# Patient Record
Sex: Male | Born: 1965 | Race: White | Hispanic: No | Marital: Single | Smoking: Former smoker
Health system: Southern US, Community
[De-identification: ages and names within clinical notes are randomized; demographics above are authoritative.]

## PROBLEM LIST (undated history)

## (undated) DIAGNOSIS — I10 Essential (primary) hypertension: Secondary | ICD-10-CM

## (undated) DIAGNOSIS — F329 Major depressive disorder, single episode, unspecified: Secondary | ICD-10-CM

## (undated) DIAGNOSIS — Z9989 Dependence on other enabling machines and devices: Secondary | ICD-10-CM

## (undated) DIAGNOSIS — Z7901 Long term (current) use of anticoagulants: Secondary | ICD-10-CM

## (undated) DIAGNOSIS — F32A Depression, unspecified: Secondary | ICD-10-CM

## (undated) DIAGNOSIS — I4891 Unspecified atrial fibrillation: Secondary | ICD-10-CM

## (undated) DIAGNOSIS — G4733 Obstructive sleep apnea (adult) (pediatric): Secondary | ICD-10-CM

---

## 2006-07-03 ENCOUNTER — Encounter: Admission: RE | Admit: 2006-07-03 | Discharge: 2006-07-03 | Payer: Self-pay | Admitting: Internal Medicine

## 2015-03-03 DIAGNOSIS — I82409 Acute embolism and thrombosis of unspecified deep veins of unspecified lower extremity: Secondary | ICD-10-CM | POA: Insufficient documentation

## 2015-03-03 DIAGNOSIS — R079 Chest pain, unspecified: Secondary | ICD-10-CM | POA: Insufficient documentation

## 2015-03-03 DIAGNOSIS — I4891 Unspecified atrial fibrillation: Secondary | ICD-10-CM | POA: Diagnosis present

## 2015-03-03 DIAGNOSIS — F419 Anxiety disorder, unspecified: Secondary | ICD-10-CM | POA: Insufficient documentation

## 2015-03-03 DIAGNOSIS — K219 Gastro-esophageal reflux disease without esophagitis: Secondary | ICD-10-CM | POA: Insufficient documentation

## 2015-03-03 DIAGNOSIS — F329 Major depressive disorder, single episode, unspecified: Secondary | ICD-10-CM | POA: Insufficient documentation

## 2015-03-03 DIAGNOSIS — G4733 Obstructive sleep apnea (adult) (pediatric): Secondary | ICD-10-CM | POA: Insufficient documentation

## 2015-03-03 DIAGNOSIS — I10 Essential (primary) hypertension: Secondary | ICD-10-CM | POA: Insufficient documentation

## 2015-03-03 DIAGNOSIS — E785 Hyperlipidemia, unspecified: Secondary | ICD-10-CM | POA: Insufficient documentation

## 2015-10-15 DIAGNOSIS — Z79899 Other long term (current) drug therapy: Secondary | ICD-10-CM | POA: Insufficient documentation

## 2015-11-11 DIAGNOSIS — Z86718 Personal history of other venous thrombosis and embolism: Secondary | ICD-10-CM | POA: Insufficient documentation

## 2015-12-21 DIAGNOSIS — Z6841 Body Mass Index (BMI) 40.0 and over, adult: Secondary | ICD-10-CM

## 2015-12-25 DIAGNOSIS — Z5181 Encounter for therapeutic drug level monitoring: Secondary | ICD-10-CM | POA: Insufficient documentation

## 2015-12-25 DIAGNOSIS — Z7901 Long term (current) use of anticoagulants: Secondary | ICD-10-CM | POA: Insufficient documentation

## 2016-08-01 DIAGNOSIS — R791 Abnormal coagulation profile: Secondary | ICD-10-CM | POA: Insufficient documentation

## 2017-04-07 DIAGNOSIS — Z0000 Encounter for general adult medical examination without abnormal findings: Secondary | ICD-10-CM | POA: Insufficient documentation

## 2017-04-07 DIAGNOSIS — R7301 Impaired fasting glucose: Secondary | ICD-10-CM | POA: Insufficient documentation

## 2018-03-05 DIAGNOSIS — R5383 Other fatigue: Secondary | ICD-10-CM | POA: Insufficient documentation

## 2018-04-09 DIAGNOSIS — J45909 Unspecified asthma, uncomplicated: Secondary | ICD-10-CM | POA: Insufficient documentation

## 2018-05-08 ENCOUNTER — Other Ambulatory Visit: Payer: Self-pay

## 2018-05-08 ENCOUNTER — Encounter (HOSPITAL_COMMUNITY): Payer: Self-pay | Admitting: Emergency Medicine

## 2018-05-08 ENCOUNTER — Inpatient Hospital Stay (HOSPITAL_COMMUNITY)
Admission: EM | Admit: 2018-05-08 | Discharge: 2018-05-10 | DRG: 309 | Disposition: A | Discharge status: Home / Self Care | Payer: BC Managed Care – PPO | Attending: Cardiology | Admitting: Cardiology

## 2018-05-08 ENCOUNTER — Emergency Department (HOSPITAL_COMMUNITY): Payer: BC Managed Care – PPO

## 2018-05-08 DIAGNOSIS — I481 Persistent atrial fibrillation: Secondary | ICD-10-CM

## 2018-05-08 DIAGNOSIS — K219 Gastro-esophageal reflux disease without esophagitis: Secondary | ICD-10-CM | POA: Diagnosis present

## 2018-05-08 DIAGNOSIS — Z7901 Long term (current) use of anticoagulants: Secondary | ICD-10-CM

## 2018-05-08 DIAGNOSIS — Z79899 Other long term (current) drug therapy: Secondary | ICD-10-CM

## 2018-05-08 DIAGNOSIS — G4733 Obstructive sleep apnea (adult) (pediatric): Secondary | ICD-10-CM | POA: Diagnosis present

## 2018-05-08 DIAGNOSIS — I4891 Unspecified atrial fibrillation: Secondary | ICD-10-CM | POA: Diagnosis present

## 2018-05-08 DIAGNOSIS — I480 Paroxysmal atrial fibrillation: Secondary | ICD-10-CM | POA: Diagnosis not present

## 2018-05-08 DIAGNOSIS — I10 Essential (primary) hypertension: Secondary | ICD-10-CM | POA: Diagnosis present

## 2018-05-08 DIAGNOSIS — Z888 Allergy status to other drugs, medicaments and biological substances status: Secondary | ICD-10-CM

## 2018-05-08 DIAGNOSIS — E7800 Pure hypercholesterolemia, unspecified: Secondary | ICD-10-CM | POA: Diagnosis not present

## 2018-05-08 DIAGNOSIS — Z8249 Family history of ischemic heart disease and other diseases of the circulatory system: Secondary | ICD-10-CM

## 2018-05-08 DIAGNOSIS — Z0189 Encounter for other specified special examinations: Secondary | ICD-10-CM

## 2018-05-08 DIAGNOSIS — Z6841 Body Mass Index (BMI) 40.0 and over, adult: Secondary | ICD-10-CM

## 2018-05-08 HISTORY — DX: Dependence on other enabling machines and devices: Z99.89

## 2018-05-08 HISTORY — DX: Long term (current) use of anticoagulants: Z79.01

## 2018-05-08 HISTORY — DX: Unspecified atrial fibrillation: I48.91

## 2018-05-08 HISTORY — DX: Depression, unspecified: F32.A

## 2018-05-08 HISTORY — DX: Essential (primary) hypertension: I10

## 2018-05-08 HISTORY — DX: Obstructive sleep apnea (adult) (pediatric): G47.33

## 2018-05-08 HISTORY — DX: Major depressive disorder, single episode, unspecified: F32.9

## 2018-05-08 LAB — CBC WITH DIFFERENTIAL/PLATELET
ABS IMMATURE GRANULOCYTES: 0.1 10*3/uL (ref 0.0–0.1)
Basophils Absolute: 0 10*3/uL (ref 0.0–0.1)
Basophils Relative: 1 %
EOS PCT: 1 %
Eosinophils Absolute: 0.1 10*3/uL (ref 0.0–0.7)
HEMATOCRIT: 48.3 % (ref 39.0–52.0)
HEMOGLOBIN: 15.8 g/dL (ref 13.0–17.0)
Immature Granulocytes: 1 %
LYMPHS ABS: 1.3 10*3/uL (ref 0.7–4.0)
LYMPHS PCT: 15 %
MCH: 30.3 pg (ref 26.0–34.0)
MCHC: 32.7 g/dL (ref 30.0–36.0)
MCV: 92.5 fL (ref 78.0–100.0)
Monocytes Absolute: 0.9 10*3/uL (ref 0.1–1.0)
Monocytes Relative: 11 %
Neutro Abs: 6 10*3/uL (ref 1.7–7.7)
Neutrophils Relative %: 71 %
Platelets: 198 10*3/uL (ref 150–400)
RBC: 5.22 MIL/uL (ref 4.22–5.81)
RDW: 14 % (ref 11.5–15.5)
WBC: 8.3 10*3/uL (ref 4.0–10.5)

## 2018-05-08 LAB — COMPREHENSIVE METABOLIC PANEL
ALBUMIN: 3.6 g/dL (ref 3.5–5.0)
ALK PHOS: 74 U/L (ref 38–126)
ALT: 44 U/L (ref 0–44)
ANION GAP: 11 (ref 5–15)
AST: 29 U/L (ref 15–41)
BUN: 9 mg/dL (ref 6–20)
CALCIUM: 9.1 mg/dL (ref 8.9–10.3)
CO2: 22 mmol/L (ref 22–32)
CREATININE: 0.83 mg/dL (ref 0.61–1.24)
Chloride: 108 mmol/L (ref 98–111)
GFR calc Af Amer: >60 mL/min (ref >60)
GFR calc non Af Amer: >60 mL/min (ref >60)
GLUCOSE: 109 mg/dL — AB (ref 70–99)
Potassium: 4.4 mmol/L (ref 3.5–5.1)
Sodium: 141 mmol/L (ref 135–145)
Total Bilirubin: 1.7 mg/dL — ABNORMAL HIGH (ref 0.3–1.2)
Total Protein: 6.5 g/dL (ref 6.5–8.1)

## 2018-05-08 LAB — MAGNESIUM: Magnesium: 2.1 mg/dL (ref 1.7–2.4)

## 2018-05-08 LAB — PROTIME-INR
INR: 3.09
Prothrombin Time: 31.6 seconds — ABNORMAL HIGH (ref 11.4–15.2)

## 2018-05-08 LAB — I-STAT TROPONIN, ED: Troponin i, poc: 0 ng/mL (ref 0.00–0.08)

## 2018-05-08 MED ORDER — SODIUM CHLORIDE 0.9% FLUSH
3.0000 mL | Freq: Two times a day (BID) | INTRAVENOUS | Status: DC
  Administered 2018-05-08 – 2018-05-10 (×4): 3 mL via INTRAVENOUS

## 2018-05-08 MED ORDER — PROPOFOL 10 MG/ML IV BOLUS
INTRAVENOUS | Status: AC | PRN
  Administered 2018-05-08: 20 mg via INTRAVENOUS
  Administered 2018-05-08: 50 mg via INTRAVENOUS
  Administered 2018-05-08: 30 mg via INTRAVENOUS

## 2018-05-08 MED ORDER — DULOXETINE HCL 30 MG PO CPEP
30.0000 mg | ORAL_CAPSULE | Freq: Every day | ORAL | Status: DC
  Administered 2018-05-09 – 2018-05-10 (×2): 30 mg via ORAL
  Filled 2018-05-08 (×2): qty 1

## 2018-05-08 MED ORDER — ACETAMINOPHEN 325 MG PO TABS: 650.0000 mg | ORAL_TABLET | ORAL | Status: DC | PRN

## 2018-05-08 MED ORDER — SODIUM CHLORIDE 0.9 % IV SOLN
250.0000 mL | INTRAVENOUS | Status: DC
Start: 2018-05-08 — End: 2018-05-10
  Administered 2018-05-08: 250 mL via INTRAVENOUS

## 2018-05-08 MED ORDER — WARFARIN - PHARMACIST DOSING INPATIENT: Freq: Every day | Status: DC

## 2018-05-08 MED ORDER — SODIUM CHLORIDE 0.9% FLUSH: 3.0000 mL | INTRAVENOUS | Status: DC | PRN

## 2018-05-08 MED ORDER — PROPOFOL 10 MG/ML IV BOLUS
1.0000 mg/kg | Freq: Once | INTRAVENOUS | Status: DC
  Filled 2018-05-08: qty 20

## 2018-05-08 MED ORDER — ONDANSETRON HCL 4 MG/2ML IJ SOLN: 4.0000 mg | Freq: Four times a day (QID) | INTRAMUSCULAR | Status: DC | PRN

## 2018-05-08 MED ORDER — PANTOPRAZOLE SODIUM 40 MG PO TBEC
40.0000 mg | DELAYED_RELEASE_TABLET | Freq: Every day | ORAL | Status: DC
  Administered 2018-05-09 – 2018-05-10 (×2): 40 mg via ORAL
  Filled 2018-05-08 (×2): qty 1

## 2018-05-08 MED ORDER — ATENOLOL 50 MG PO TABS
50.0000 mg | ORAL_TABLET | Freq: Two times a day (BID) | ORAL | Status: DC
  Administered 2018-05-08 – 2018-05-10 (×4): 50 mg via ORAL
  Filled 2018-05-08 (×6): qty 1

## 2018-05-08 NOTE — Sedation Documentation (Signed)
Shock advised 200J

## 2018-05-08 NOTE — Sedation Documentation (Addendum)
Patient is resting comfortably. 

## 2018-05-08 NOTE — ED Notes (Signed)
Patient transported to X-ray 

## 2018-05-08 NOTE — ED Notes (Signed)
ED Provider at bedside. 

## 2018-05-08 NOTE — H&P (Signed)
Cardiology Admission History and Physical:   Patient ID: [name]; MRN: [id number]; DOB: 26-Aug-1966   Admission date: 05/08/2018  Primary Care Provider: [name], MD Primary Cardiologist: Dr. Fayrene Fearing, [facility]  Chief Complaint: Palpitations  Patient Profile:   Joshua Mcgee is a 52 y.o. 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   History of Present Illness:   Joshua Mcgee is a pleasant 52 year old male with a history stated above who presented to [facility] on 05/08/2018 from work after having a sudden onset of continuous palpitations and fluttering in his chest that began earlier today at approximately 6 AM. He reports a similar sensation when he was initially diagnosed with AF seven years ago. He has been well rate controlled on atenolol with intermittent "brief palpitations" at times, although nothing like what he experienced this AM. He has been safely anticoagulated with Coumadin for many years secondary to DVT's. His INR is checked by a clinic in [facility]. He reports that he was recently seen and treated with Mucinex D for a cough and congestion, last dose was Monday AM.  Additionally, his anxiety medications were recently adjusted from Effexor to Cymbalta with improvement. He denies chest pain, dizziness, shortness of breath, recent fatigue, orthopnea or lower extremity swelling and was in his usual state of health prior to this episode. He states that he has not missed any doses of his Coumadin.  He is followed at [facility] for his health care. He denies tobacco, alcohol or illicit drug use. He reports one caffeinated drink per day. He works in Production designer, theatre/television/film.    In the ED, an EKG revealed atrial fibrillation with moderate rate control at 105.  Initial i-STAT troponin negative at 0.00.  His INR level  today is 3.09.  CXR with minimal bronchitic changes without acute infiltrate. Given that he has been appropriately anticoagulated without missed Coumadin doses, the plan was to cardiovert him and discharge him home with close follow-up in AF clinic. Unfortunately, cardioversion was unsuccessful and he returned to atrial fibrillation after 2 attempts at 200 J biphasic.  Per chart review, last echocardiogram 03/07/2018 with poor quality study related to body habitus however, noted to have grossly normal LV function. He had an exercise stress echocardiogram in 2016 which was normal. Otherwise, no other cardiac concerns.   Past Medical History:  Diagnosis Date  . Atrial fibrillation (HCC)    on Coumadin  . Chronic anticoagulation   . Depression   . Hypertension   . OSA on CPAP     The histories are not reviewed yet. Please review them in the "History" navigator section and refresh this SmartLink.   Medications Prior to Admission: Prior to Admission medications   Not on File     Allergies:    Allergies  Allergen Reactions  . Diphenhydramine Hcl Anaphylaxis    Kidney pain Kidney pain Kidney pain   . Prednisone Palpitations    Social History:   Social History   Socioeconomic History  . Marital status: Unknown    Spouse name: Not on file  . Number of children: Not on file  . Years of education: Not on file  . Highest education level: Not on file  Occupational History  . Not on file  Social Needs  . Financial resource strain: Not on file  . Food insecurity:  Worry: Not on file    Inability: Not on file  . Transportation needs:    Medical: Not on file    Non-medical: Not on file  Tobacco Use  . Smoking status: Never Smoker  Substance and Sexual Activity  . Alcohol use: Not Currently  . Drug use: Never  . Sexual activity: Not on file  Lifestyle  . Physical activity:    Days per week: Not on file    Minutes per session: Not on file  . Stress: Not on file    Relationships  . Social connections:    Talks on phone: Not on file    Gets together: Not on file    Attends religious service: Not on file    Active member of club or organization: Not on file    Attends meetings of clubs or organizations: Not on file    Relationship status: Not on file  . Intimate partner violence:    Fear of current or ex partner: Not on file    Emotionally abused: Not on file    Physically abused: Not on file    Forced sexual activity: Not on file  Other Topics Concern  . Not on file  Social History Narrative  . Not on file    Family History:  The patient's family history includes Diabetes in his mother; Heart disease in his mother.    ROS:  Please see the history of present illness.  All other ROS reviewed and negative.     Physical Exam/Data:   Vitals:   05/08/18 1440 05/08/18 1445 05/08/18 1515 05/08/18 1608  BP: (!) 85/68 92/73 107/80 104/73  Pulse: 90 97 80 92  Resp: (!) 23 20 16 20   Temp:      TempSrc:      SpO2: 96% 96% 97% 97%  Weight:      Height:       No intake or output data in the 24 hours ending 05/08/18 1643 Filed Weights   05/08/18 0859  Weight: 300 lb (136.1 kg)   Body mass index is 41.84 kg/m.   General: Obese, NAD Skin: Warm, dry, intact  Head: Normocephalic, atraumatic, clear, moist mucus membranes. Neck: Negative for carotid bruits. No JVD Lungs:Clear to ausculation bilaterally. No wheezes, rales, or rhonchi. Breathing is unlabored. Cardiovascular: Irregularly irregular with S1 S2. No murmurs, rubs or gallops Abdomen: Soft, non-tender, non-distended with normoactive bowel sounds.No obvious abdominal masses. MSK: Strength and tone appear normal for age. 5/5 in all extremities Extremities: 1+LE edema. No clubbing or cyanosis. DP/PT pulses 2+ bilaterally Neuro: Alert and oriented. No focal deficits. No facial asymmetry. MAE spontaneously. Psych: Responds to questions appropriately with normal affect.     EKG:  The ECG  that was done 05/08/2018 was personally reviewed and demonstrates atrial fibrillation with heart rate 106  Relevant CV Studies: Echocardiogram stress treadmill with contrast: 03/04/2015 Procedure Type of Study  Stress procedure: Dobutamine Stress Echo with Contrast. Procedure Date Date: 03/04/2015 Start: 10:55 AM Technical Quality: Adequate visualization Study Location: Portable Patient Status: Routine Rhythm: Normal sinus rhythm Conclusions Summary Functional capacity is fair for age/sex - 8 mets on the 2-min Bruce protocol Normal resting biventricular function (ejection fraction), with no resting segmental abnormality. No clinical or echocardiographic ischemia (induced wall motion abnormality): Negative stress echocardiogram  Laboratory Data:  Chemistry Recent Labs  Lab 05/08/18 1018  NA 141  K 4.4  CL 108  CO2 22  GLUCOSE 109*  BUN 9  CREATININE 0.83  CALCIUM  9.1  GFRNONAA >60  GFRAA >60  ANIONGAP 11    Recent Labs  Lab 05/08/18 1018  PROT 6.5  ALBUMIN 3.6  AST 29  ALT 44  ALKPHOS 74  BILITOT 1.7*   Hematology Recent Labs  Lab 05/08/18 1018  WBC 8.3  RBC 5.22  HGB 15.8  HCT 48.3  MCV 92.5  MCH 30.3  MCHC 32.7  RDW 14.0  PLT 198   Cardiac EnzymesNo results for input(s): TROPONINI in the last 168 hours.  Recent Labs  Lab 05/08/18 1036  TROPIPOC 0.00    BNPNo results for input(s): BNP, PROBNP in the last 168 hours.  DDimer No results for input(s): DDIMER in the last 168 hours.  Radiology/Studies:  Dg Chest 2 View  Result Date: 05/08/2018 CLINICAL DATA:  Cough, congestion, palpitations, atrial fibrillation EXAM: CHEST - 2 VIEW COMPARISON:  None FINDINGS: Normal heart size, mediastinal contours, and pulmonary vascularity. Minimal bronchitic changes. Lungs clear. No pulmonary infiltrate, pleural effusion, or pneumothorax. Osseous demineralization with endplate spur formation thoracic spine. IMPRESSION: Minimal bronchitic changes without acute  infiltrate. Electronically Signed   By: Ulyses SouthwardMark  Boles M.D.   On: 05/08/2018 09:37   Assessment and Plan:   1.  Atrial fibrillation with RVR: -Patient has a history of atrial fibrillation and DVT anticoagulated on [facility]mad[facility] with complaints of continuous palpitations found to be in atrial fibrillation with moderate rate control.  DCCV attempted x2 while in the emergency room with 200 J however, was unsuccessful.  -Patient had a negative stress echocardiogram 03/04/2015. Last echocardiogram 03/03/2015 with poor acoustic windows, EF visually estimated at 60 to 65% with mild concentric left ventricular hypertrophy or rotation complicated by body habitus -Will plan for more recent echocardiogram this admission and adjust medications for greater rate control in hopes to improve his symptoms  -Will continue home dose atenolol 50mg  twice daily and hold valsartan given his low normal BP  -Obtain echocardiogram and have EP evaluate -Will likely start diltiazem, however will reassess in AM given low normal BP   2.  Hypertension: -Controlled, 107/80, 102/73, 119/78 -On home dose atenolol 50 mg twice daily, hold valsartan  3.  Hyperlipidemia: -Last LDL 04/02/2018 149 -Not currently on statin therapy  4.  OSA: -Stable, uses CPAP nightly -Reports compliance   5.  Obesity: -BMI, 41.84 -Encouraged increased exercise and diet modifications -Will obtain HbA1c   Severity of Illness: The appropriate patient status for this patient is OBSERVATION. Observation status is judged to be reasonable and necessary in order to provide the required intensity of service to ensure the patient's safety. The patient's presenting symptoms, physical exam findings, and initial radiographic and laboratory data in the context of their medical condition is felt to place them at decreased risk for further clinical deterioration. Furthermore, it is  anticipated that the patient will be medically stable for discharge from the hospital within 2 midnights of admission. The following factors support the patient status of observation.   " The patient's presenting symptoms include palpitations " The physical exam findings include atrial fibrillation  " The initial radiographic and laboratory data are failed DCCV and EKG with AF.     For questions or updates, please contact [facility] Please consult [email or web address] for contact info under Cardiology/STEMI.    Signed, Georgie ChardJill McDaniel, NP  05/08/2018 4:43 PM

## 2018-05-08 NOTE — ED Triage Notes (Signed)
Pt arrives by [facility] coming from work after having a sudden onset of palpations and a fluttering in his chest that began at 0600 today. Pt reports history of this happening before takes atenolol for this. Pt is alert and ox4, breathing easy at this time appears in no acute distress.

## 2018-05-08 NOTE — Sedation Documentation (Signed)
Shock advised at 200J  

## 2018-05-08 NOTE — ED Notes (Signed)
Cards at bedside pt okay to eat.

## 2018-05-08 NOTE — ED Provider Notes (Addendum)
[facility]GENCY [facility]PART[facility] Provider Note   CSN: [id number] Arrival date & time: 05/08/18  [id number]     History   Chief Complaint Chief Complaint  Patient presents with  . Atrial Fibrillation    HPI Joshua Simmerony Mcgee is a 52 y.o. male.  The history is provided by the patient and medical records. No language interpreter was used.  Palpitations   This is a recurrent problem. The current episode started 3 to 5 hours ago. The problem occurs constantly. The problem has not changed since onset.The problem is associated with anxiety (pseudoephedrine and anxiety med change). Associated symptoms include irregular heartbeat, cough and sputum production. Pertinent negatives include no diaphoresis, no fever, no malaise/fatigue, no numbness, no chest pain, no chest pressure, no exertional chest pressure, no near-syncope, no syncope, no abdominal pain, no nausea, no vomiting, no headaches, no back pain, no lower extremity edema, no weakness, no hemoptysis and no shortness of breath. He has tried nothing for the symptoms. The treatment provided no relief.    No past medical history on file.  There are no active problems to display for this patient.    Home Medications    Prior to Admission medications   Not on File    Family History No family history on file.  Social History Social History   Tobacco Use  . Smoking status: Not on file  Substance Use Topics  . Alcohol use: Not on file  . Drug use: Not on file     Allergies   Patient has no allergy information on record.   Review of Systems Review of Systems  Constitutional: Negative for appetite change, diaphoresis, fatigue, fever and malaise/fatigue.  HENT: Positive for congestion.   Respiratory: Positive for cough and sputum production. Negative for hemoptysis, choking, chest tightness, shortness of breath and wheezing.   Cardiovascular: Positive for palpitations. Negative for chest pain, leg swelling, syncope  and near-syncope.  Gastrointestinal: Negative for abdominal pain, constipation, diarrhea, nausea and vomiting.  Genitourinary: Negative for flank pain.  Musculoskeletal: Negative for back pain, neck pain and neck stiffness.  Neurological: Negative for weakness, light-headedness, numbness and headaches.  Psychiatric/Behavioral: Negative for agitation and confusion.  All other systems reviewed and are negative.    Physical Exam Updated Vital Signs BP 119/88 (BP Location: Left Arm)   Pulse (!) 103   Temp 97.9 F (36.6 C) (Oral)   Resp 20   Ht 5\' 11"  (1.803 m)   Wt 136.1 kg (300 lb)   SpO2 95%   BMI 41.84 kg/m   Physical Exam  Constitutional: He is oriented to person, place, and time. He appears well-developed and well-nourished. No distress.  HENT:  Head: Normocephalic and atraumatic.  Nose: Nose normal.  Mouth/Throat: Oropharynx is clear and moist. No oropharyngeal exudate.  Eyes: Pupils are equal, round, and reactive to light. Conjunctivae and EOM are normal.  Neck: Neck supple.  Cardiovascular: Normal rate and intact distal pulses.  No murmur heard. Pulmonary/Chest: Effort normal and breath sounds normal. No respiratory distress. He has no wheezes. He has no rales. He exhibits no tenderness.  Abdominal: Soft. There is no tenderness.  Musculoskeletal: He exhibits no edema or tenderness.  Neurological: He is alert and oriented to person, place, and time. No sensory deficit.  Skin: Skin is warm and dry. Capillary refill takes less than 2 seconds. He is not diaphoretic. No erythema. No pallor.  Psychiatric: He has a normal mood and affect.  Nursing note and vitals reviewed.  ED Treatments / Results  Labs (all labs ordered are listed, but only abnormal results are displayed) Labs Reviewed  PROTIME-INR - Abnormal; Notable for the following components:      Result Value   Prothrombin Time 31.6 (*)    All other components within normal limits  COMPREHENSIVE METABOLIC  PANEL - Abnormal; Notable for the following components:   Glucose, Bld 109 (*)    Total Bilirubin 1.7 (*)    All other components within normal limits  CBC WITH DIFFERENTIAL/PLATELET  MAGNESIUM  I-STAT TROPONIN, ED    EKG EKG Interpretation  Date/Time:  Tuesday May 08 2018 14:43:25 EDT Ventricular Rate:  103 PR Interval:    QRS Duration: 90 QT Interval:  355 QTC Calculation: 465 R Axis:   -12 Text Interpretation:  Atrial fibrillation Low voltage, precordial leads Borderline T abnormalities, anterior leads When compared to prior, remains in afib.  No STEMI Confirmed by Theda Belfast ([id number]) on 05/08/2018 2:51:47 PM   Radiology Dg Chest 2 View  Result Date: 05/08/2018 CLINICAL DATA:  Cough, congestion, palpitations, atrial fibrillation EXAM: CHEST - 2 VIEW COMPARISON:  None FINDINGS: Normal heart size, mediastinal contours, and pulmonary vascularity. Minimal bronchitic changes. Lungs clear. No pulmonary infiltrate, pleural effusion, or pneumothorax. Osseous demineralization with endplate spur formation thoracic spine. IMPRESSION: Minimal bronchitic changes without acute infiltrate. Electronically Signed   By: Ulyses Southward M.D.   On: 05/08/2018 09:37    Procedures .Cardioversion Date/Time: 05/08/2018 3:41 PM Performed by: Heide Scales, MD Authorized by: Heide Scales, MD   Consent:    Consent obtained:  Written   Consent given by:  Patient   Risks discussed:  Cutaneous burn, induced arrhythmia and pain   Alternatives discussed:  No treatment Pre-procedure details:    Cardioversion basis:  Elective   Rhythm:  Atrial fibrillation   Electrode placement:  Anterior-posterior Patient sedated: Yes. Refer to sedation procedure documentation for details of sedation.  Attempt one:    Cardioversion mode:  Synchronous   Waveform:  Biphasic   Shock (Joules):  200   Shock outcome:  No change in rhythm Attempt two:    Cardioversion mode:  Synchronous   Waveform:   Biphasic   Shock (Joules):  200   Shock outcome:  No change in rhythm Post-procedure details:    Patient status:  Awake   Patient tolerance of procedure:  Tolerated well, no immediate complications .Sedation Date/Time: 05/08/2018 3:42 PM Performed by: Heide Scales, MD Authorized by: Heide Scales, MD   Consent:    Consent obtained:  Written   Consent given by:  Patient Universal protocol:    Immediately prior to procedure a time out was called: yes     Patient identity confirmation method:  Anonymous protocol, patient vented/unresponsive, verbally with patient and hospital-assigned identification number Indications:    Procedure performed:  Cardioversion   Intended level of sedation:  Moderate (conscious sedation) Pre-sedation assessment:    Time since last food or drink:  This AM   ASA classification: class 2 - patient with mild systemic disease     Neck mobility: normal     Mouth opening:  3 or more finger widths   Thyromental distance:  4 finger widths   Mallampati score:  II - soft palate, uvula, fauces visible   Pre-sedation assessments completed and reviewed: airway patency not reviewed, cardiovascular function not reviewed, hydration status not reviewed, mental status not reviewed, nausea/vomiting not reviewed and pain level not reviewed   Immediate pre-procedure  details:    Reviewed: vital signs     Verified: bag valve mask available, emergency equipment available, intubation equipment available, IV patency confirmed, oxygen available and suction available   Procedure details (see MAR for exact dosages):    Preoxygenation:  Nasal cannula   Total Provider sedation time (minutes):  30 Post-procedure details:    Attendance: Constant attendance by certified staff until patient recovered     Recovery: Patient returned to pre-procedure baseline     Patient is stable for discharge or admission: yes     Patient tolerance:  Tolerated well, no immediate  complications   (including critical care time)  CRITICAL CARE Performed by: Canary Brim Tegeler Total critical care time: 45 minutes Critical care time was exclusive of separately billable procedures and treating other patients. Critical care was necessary to treat or prevent imminent or life-threatening deterioration. Critical care was time spent personally by me on the following activities: development of treatment plan with patient and/or surrogate as well as nursing, discussions with consultants, evaluation of patient's response to treatment, examination of patient, obtaining history from patient or surrogate, ordering and performing treatments and interventions, ordering and review of laboratory studies, ordering and review of radiographic studies, pulse oximetry and re-evaluation of patient's condition.   CHA2Ds2-VASc Score for Atrial Fibrillation    Patient Score  Age <65 = 0 65-74 = 1 > 75 = 2 0  Sex Male = 0 Male = 1 0  CHF History No = 0  Yes = 1 0  HTN History No = 0  Yes = 1 1  Stroke/TIA/TE History No = 0  Yes = 1 1  Vascular Disease History No = 0  Yes = 1 0  Diabetes History No = 0  Yes = 1 0  Total:  1   0.6 % stroke rate/year from a score of 1  Medications Ordered in ED Medications  sodium chloride flush (NS) 0.9 % injection 3 mL (3 mLs Intravenous Not Given 05/08/18 1443)  sodium chloride flush (NS) 0.9 % injection 3 mL (has no administration in time range)  0.9 %  sodium chloride infusion (250 mLs Intravenous New Bag/Given 05/08/18 1444)  propofol (DIPRIVAN) 10 mg/mL bolus/IV push 136.1 mg (has no administration in time range)  propofol (DIPRIVAN) 10 mg/mL bolus/IV push (20 mg Intravenous Given 05/08/18 1435)     Initial Impression / Assessment and Plan / ED Course  I have reviewed the triage vital signs and the nursing notes.  Pertinent labs & imaging results that were available during my care of the patient were reviewed by me and considered in my  medical decision making (see chart for details).     Joshua Mcgee is a 52 y.o. 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  Patient reports that he has had congestion for the last week and started taking Mucinex D with Sudafed 2 days ago.  He reports he took yesterday as well.  He says that they also recently changed some of his anxiety medications from Paxil to Effexor 37.5 mg o.  He reports he is still feeling very anxious.  He says that this morning at work at around 6:30 AM he started feeling palpitations and a fluttering sensation.  He reports is the same as it was the last time he had A. fib.  He denies any chest pain, syncope, and only  has mild lightheadedness.  He denies any shortness of breath.  He reports that he had cough with some phlegm-like sputum over the last week.  He denies nausea vomiting, fevers, chills, diaphoresis or other symptoms.  He denies any new leg pain or leg swelling.  He has not missed any doses of his Coumadin.   On exam, patient has an irregular pulse on palpation.  Initial EKG shows A. fib.  Patient's lungs are clear and chest is nontender.  Patient otherwise appears well.  Patient will have laboratory testing to look for electrolyte imbalance or abnormality.  Patient also chest x-ray given the productive cough.  Patient had his INR checked.  Given his discrete onset of symptoms and his certainty that began this morning, anticipate patient may be suitable for ED cardioversion.  We will follow-up on his laboratory testing and x-ray prior to possible cardioversion.     Chest x-ray shows no evidence of pneumonia.  Magnesium and potassium were normal.   1:25 PM Diagnostic work-up was reassuring.  Patient had INR 3.09 and is not subtherapeutic.  CBC and metabolic panel reassuring.  Cardiology  agreed with ED cardioversion and follow-up in A. fib clinic.  Orders for propofol be placed for procedural sedation and patient will be converted.  3:40 PM Cardioversion was unsuccessful.  Patient had 2 cardioversion attempts at 200 J biphasic without successful cardioversion.  Patient reports that he has required 300 J in the past.  Patient woke up from sedation without difficulty and remains in A. fib.    Cardiology was called and will come to the patient.  Patient is concerned about going home given his persistent palpitations and lightheadedness.  Anticipate following up on cardiology recommendations.    Final Clinical Impressions(s) / ED Diagnoses   Final diagnoses:  Paroxysmal atrial fibrillation (HCC)  Encounter for cardioversion procedure     Clinical Impression: 1. Paroxysmal atrial fibrillation (HCC)   2. Encounter for cardioversion procedure     Disposition: Awaiting cardiology recommendations  This note was prepared with assistance of Dragon voice recognition software. Occasional wrong-word or sound-a-like substitutions may have occurred due to the inherent limitations of voice recognition software.      Tegeler, Canary Brim, MD 05/08/18 1637    Tegeler, Canary Brim, MD 05/19/18 [phone]

## 2018-05-08 NOTE — Progress Notes (Signed)
ANTICOAGULATION CONSULT NOTE - Initial Consult  Pharmacy Consult for warfarin dosing Indication: atrial fibrillation   Allergies  Allergen Reactions  . Diphenhydramine Hcl Anaphylaxis    Kidney pain Kidney pain Kidney pain   . Prednisone Palpitations    Patient Measurements: Height: 5\' 11"  (180.3 cm) Weight: 300 lb (136.1 kg) IBW/kg (Calculated) : 75.3 HEPARIN DW (KG): 106.7  Vital Signs: Temp: 98.3 F (36.8 C) (08/06 1423) Temp Source: Oral (08/06 1423) BP: 109/93 (08/06 1630) Pulse Rate: 92 (08/06 1630)  Labs: Recent Labs    05/08/18 1018  HGB 15.8  HCT 48.3  PLT 198  LABPROT 31.6*  INR 3.09  CREATININE 0.83    Estimated Creatinine Clearance: 143.3 mL/min (by C-G formula based on SCr of 0.83 mg/dL).   Medical History: Past Medical History:  Diagnosis Date  . Atrial fibrillation (HCC)    on Coumadin  . Chronic anticoagulation   . Depression   . Hypertension   . OSA on CPAP     Assessment: 52 year old with history of atrial fibrillation and multiple DVTs, on warfarin PTA (last dose 8/6), presenting with palpitations. After unsuccessful attempts at cardioversion in ED, patient currently held for observation. Pharmacy consulted to dose warfarin. Home regimen: 5mg  daily except for 10mg  Wed, Fri, Sun.   INR slightly supratherapeutic at 3.09. No documented bleeding, CBC WNL.   Goal of Therapy:  INR 2-3 Monitor platelets by anticoagulation protocol: Yes   Plan:  Hold warfarin tonight as dose already given this morning Monitor Daily INR, CBC, s/sx bleeding   Harlow MaresAmy Isaiah Torok, PharmD PGY1 Pharmacy Resident Phone [phone]  05/08/2018   5:05 PM

## 2018-05-09 ENCOUNTER — Observation Stay (HOSPITAL_BASED_OUTPATIENT_CLINIC_OR_DEPARTMENT_OTHER): Payer: BC Managed Care – PPO

## 2018-05-09 DIAGNOSIS — I4891 Unspecified atrial fibrillation: Secondary | ICD-10-CM | POA: Diagnosis present

## 2018-05-09 DIAGNOSIS — Z8249 Family history of ischemic heart disease and other diseases of the circulatory system: Secondary | ICD-10-CM | POA: Diagnosis not present

## 2018-05-09 DIAGNOSIS — I481 Persistent atrial fibrillation: Secondary | ICD-10-CM | POA: Diagnosis not present

## 2018-05-09 DIAGNOSIS — K219 Gastro-esophageal reflux disease without esophagitis: Secondary | ICD-10-CM | POA: Diagnosis present

## 2018-05-09 DIAGNOSIS — Z6841 Body Mass Index (BMI) 40.0 and over, adult: Secondary | ICD-10-CM | POA: Diagnosis not present

## 2018-05-09 DIAGNOSIS — I480 Paroxysmal atrial fibrillation: Secondary | ICD-10-CM | POA: Diagnosis present

## 2018-05-09 DIAGNOSIS — G4733 Obstructive sleep apnea (adult) (pediatric): Secondary | ICD-10-CM | POA: Diagnosis present

## 2018-05-09 DIAGNOSIS — Z79899 Other long term (current) drug therapy: Secondary | ICD-10-CM | POA: Diagnosis not present

## 2018-05-09 DIAGNOSIS — Z888 Allergy status to other drugs, medicaments and biological substances status: Secondary | ICD-10-CM | POA: Diagnosis not present

## 2018-05-09 DIAGNOSIS — I10 Essential (primary) hypertension: Secondary | ICD-10-CM | POA: Diagnosis present

## 2018-05-09 DIAGNOSIS — Z7901 Long term (current) use of anticoagulants: Secondary | ICD-10-CM | POA: Diagnosis not present

## 2018-05-09 LAB — BASIC METABOLIC PANEL
ANION GAP: 10 (ref 5–15)
Anion gap: 10 (ref 5–15)
BUN: 13 mg/dL (ref 6–20)
BUN: 9 mg/dL (ref 6–20)
CALCIUM: 8.4 mg/dL — AB (ref 8.9–10.3)
CHLORIDE: 107 mmol/L (ref 98–111)
CO2: 23 mmol/L (ref 22–32)
CO2: 26 mmol/L (ref 22–32)
CREATININE: 0.89 mg/dL (ref 0.61–1.24)
Calcium: 8.8 mg/dL — ABNORMAL LOW (ref 8.9–10.3)
Chloride: 105 mmol/L (ref 98–111)
Creatinine, Ser: 0.94 mg/dL (ref 0.61–1.24)
GFR calc Af Amer: >60 mL/min (ref >60)
GFR calc Af Amer: >60 mL/min (ref >60)
GFR calc non Af Amer: >60 mL/min (ref >60)
GLUCOSE: 104 mg/dL — AB (ref 70–99)
GLUCOSE: 111 mg/dL — AB (ref 70–99)
POTASSIUM: 4.4 mmol/L (ref 3.5–5.1)
Potassium: 3.7 mmol/L (ref 3.5–5.1)
SODIUM: 141 mmol/L (ref 135–145)
Sodium: 140 mmol/L (ref 135–145)

## 2018-05-09 LAB — ECHOCARDIOGRAM COMPLETE
Height: 71 in
Weight: 6272 oz

## 2018-05-09 LAB — PROTIME-INR
INR: 3.63
INR: 3.89
PROTHROMBIN TIME: 35.9 s — AB (ref 11.4–15.2)
PROTHROMBIN TIME: 37.9 s — AB (ref 11.4–15.2)

## 2018-05-09 LAB — CBC
HCT: 50 % (ref 39.0–52.0)
Hemoglobin: 16.3 g/dL (ref 13.0–17.0)
MCH: 30.3 pg (ref 26.0–34.0)
MCHC: 32.6 g/dL (ref 30.0–36.0)
MCV: 92.9 fL (ref 78.0–100.0)
PLATELETS: 192 10*3/uL (ref 150–400)
RBC: 5.38 MIL/uL (ref 4.22–5.81)
RDW: 14 % (ref 11.5–15.5)
WBC: 7.2 10*3/uL (ref 4.0–10.5)

## 2018-05-09 LAB — TROPONIN I
Troponin I: <0.03 ng/mL (ref <0.03)
Troponin I: <0.03 ng/mL (ref <0.03)

## 2018-05-09 LAB — HEMOGLOBIN A1C
HEMOGLOBIN A1C: 5.5 % (ref 4.8–5.6)
MEAN PLASMA GLUCOSE: 111.15 mg/dL

## 2018-05-09 LAB — TSH: TSH: 0.974 u[IU]/mL (ref 0.350–4.500)

## 2018-05-09 LAB — HIV ANTIBODY (ROUTINE TESTING W REFLEX): HIV Screen 4th Generation wRfx: NONREACTIVE

## 2018-05-09 MED ORDER — FLECAINIDE ACETATE 100 MG PO TABS
300.0000 mg | ORAL_TABLET | Freq: Once | ORAL | Status: AC
  Administered 2018-05-09: 300 mg via ORAL
  Filled 2018-05-09: qty 3

## 2018-05-09 MED ORDER — PERFLUTREN LIPID MICROSPHERE
1.0000 mL | INTRAVENOUS | Status: AC | PRN
  Administered 2018-05-09: 3 mL via INTRAVENOUS
  Filled 2018-05-09 (×2): qty 10

## 2018-05-09 NOTE — Consult Note (Addendum)
Cardiology Consultation:   Patient ID: Skeet Simmer; [id number]; [birth year]   Admit date: 05/08/2018 Date of Consult: 05/09/2018  Primary Care Provider: [name], MD Primary Cardiologist: Dr. Fayrene Fearing (HP), once 7 years ago, would like to start care with [facility], Dr. Mayford Knife Primary Electrophysiologist:  [place], Dr. Johney Frame   Patient Profile:   Akif Weldy is a 52 y.o. 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 Mayford Knife.  History of Present Illness:   Mr. Krall reports his 1st episode was 7 years ago, new immediately something was off, suddenly felt like all his energy was drained.  He was found in AFib, cardioverted, and started on atenolol, he states rx to be BID, though can only tolerate daily dosing makes him feel to tired, "like it lowers my BP".  He has not had cardiac f/u since that time (was not told he needed to).  He is unclear exactly what prompted the echo in June  He states over the years he has had momentary palpitations, but nothing at all persistent until yesterday.  He reports compliance with his warfarin, INR yesterday was 3.09 and attempted to DCCV in the ER though 2 shocks at 200J were unsuccessful.  He tells me he feels pretty terrible in AFib and can not see staying in rate controlled AFib as an option.  Super morbidly obese, weight is 392lbs LA size on his echo in June was 48mm (technically limited) OSA he reports he is compliant with He denies ETOH, smoking, drugs  LABS K+ 4.4 BUN/Creat 9/0.94 Mag 2.1 WBC 7.2 H/H 16/50 Plts 192  INR 3.09 > 3.63 > 3.89  Past Medical History:  Diagnosis Date  . Atrial fibrillation (HCC)    on Coumadin  . Chronic anticoagulation   . Depression   . Hypertension   . OSA on CPAP     History reviewed. No pertinent surgical history.    Home Medications:  Prior to Admission medications   Medication Sig Start Date End Date Taking? Authorizing Provider  albuterol (PROVENTIL HFA;VENTOLIN HFA) 108 (90 Base) MCG/ACT inhaler Inhale 2 puffs into the lungs every 4 (four) hours as needed. 04/09/18  Yes [provider]  atenolol (TENORMIN) 50 MG tablet Take 50 mg by mouth 2 (two) times daily. 09/18/17  Yes [provider]  DULoxetine (CYMBALTA) 30 MG capsule Take 30 mg by mouth daily. 04/16/18  Yes [provider]  omeprazole (PRILOSEC) 20 MG capsule Take 20 mg by mouth daily.   Yes [provider]  valsartan (DIOVAN) 160 MG tablet Take 160 mg by mouth daily. 03/05/18  Yes [provider]  warfarin (COUMADIN) 10 MG tablet Take 10 mg by mouth See admin instructions. Take 5 mg Mon. Tues. Thurs and, Sat   Take 10 mg  Wed. Sun. Fri  Take in the morning 04/24/18  Yes [provider]    Inpatient Medications: Scheduled Meds: . atenolol  50 mg Oral BID  . DULoxetine  30 mg Oral Daily  . pantoprazole  40 mg Oral Daily  . sodium chloride flush  3 mL Intravenous Q12H  . Warfarin - Pharmacist Dosing Inpatient   Does not apply q1800   Continuous Infusions: . sodium chloride 250 mL (05/08/18 1444)   PRN Meds: acetaminophen, ondansetron (ZOFRAN) IV, sodium chloride  flush  Allergies:    Allergies  Allergen Reactions  . Diphenhydramine Hcl Anaphylaxis    Kidney pain Kidney pain Kidney pain   . Prednisone Palpitations    Social History:   Social History   Socioeconomic History  . Marital status: Unknown    Spouse name: Not on file  . Number of children: Not on file  . Years of education: Not on file  . Highest education level: Not on file  Occupational History  . Not on file  Social Needs  . Financial resource strain: Not on file  . Food insecurity:    Worry: Not on file    Inability: Not on file  . Transportation needs:    Medical: Not on file    Non-medical: Not on  file  Tobacco Use  . Smoking status: Former Games developer  . Smokeless tobacco: Current User  Substance and Sexual Activity  . Alcohol use: Not Currently  . Drug use: Never  . Sexual activity: Not on file  Lifestyle  . Physical activity:    Days per week: Not on file    Minutes per session: Not on file  . Stress: Not on file  Relationships  . Social connections:    Talks on phone: Not on file    Gets together: Not on file    Attends religious service: Not on file    Active member of club or organization: Not on file    Attends meetings of clubs or organizations: Not on file    Relationship status: Not on file  . Intimate partner violence:    Fear of current or ex partner: Not on file    Emotionally abused: Not on file    Physically abused: Not on file    Forced sexual activity: Not on file  Other Topics Concern  . Not on file  Social History Narrative  . Not on file    Family History:    Family History  Problem Relation Age of Onset  . Heart disease Mother   . Diabetes Mother      ROS:  Please see the history of present illness.  All other ROS reviewed and negative.     Physical Exam/Data:   Vitals:   05/08/18 2102 05/09/18 0021 05/09/18 0451 05/09/18 1138  BP: 109/65 105/68 118/74 95/79  Pulse: 64 82 92 85  Resp: 16 16 17 20   Temp: 99.1 F (37.3 C) 98.3 F (36.8 C) 98 F (36.7 C) 97.9 F (36.6 C)  TempSrc: Oral Oral Oral Oral  SpO2: 97% 96% 94% 97%  Weight:      Height:        Intake/Output Summary (Last 24 hours) at 05/09/2018 1208 Last data filed at 05/09/2018 0914 Gross per 24 hour  Intake 240 ml  Output -  Net 240 ml   Filed Weights   05/08/18 0859 05/08/18 1901  Weight: 300 lb (136.1 kg) (!) 392 lb (177.8 kg)   Body mass index is 54.67 kg/m.  General:  Morbidly obese, in no acute distress HEENT: normal Lymph: no adenopathy Neck: no JVD Endocrine:  No thryomegaly Vascular: No carotid bruits  Cardiac: iRRR; no murmurs, gallops or rubs are  appreciated Lungs:  clear to auscultation bilaterally, no wheezing, rhonchi or rales  Abd: soft, nontender, morbidly obese Ext: no edema Musculoskeletal:  No deformities Skin: warm and dry  Neuro:  No gross focal abnormalities noted Psych:  Normal affect   EKG:  The EKG was personally reviewed and  demonstrates:   AFib 95bpm,no ST/T changes No historical EKGs available for review Telemetry:  Telemetry was personally reviewed and demonstrates:   AFib 90's  Relevant CV Studies:  TTE [facility]) 03/07/18 Conclusions  Summary  poor quality study d/t body habitus  grossly normal LV function  no effusion  no other comments possible based on quality of study   Stress echo [facility]) 03/04/15 Summary Functional capacity is fair for age/sex - 8 mets on the 2-min Bruce protocol Normal resting biventricular function (ejection fraction), with no resting segmental abnormality. No clinical or echocardiographic ischemia (induced wall motion abnormality): Negative stress echocardiogram  Laboratory Data:  Chemistry Recent Labs  Lab 05/08/18 1018 05/09/18 0159 05/09/18 0740  NA 141 140 141  K 4.4 3.7 4.4  CL 108 107 105  CO2 22 23 26   GLUCOSE 109* 104* 111*  BUN 9 13 9   CREATININE 0.83 0.89 0.94  CALCIUM 9.1 8.4* 8.8*  GFRNONAA >60 >60 >60  GFRAA >60 >60 >60  ANIONGAP 11 10 10     Recent Labs  Lab 05/08/18 1018  PROT 6.5  ALBUMIN 3.6  AST 29  ALT 44  ALKPHOS 74  BILITOT 1.7*   Hematology Recent Labs  Lab 05/08/18 1018 05/09/18 0740  WBC 8.3 7.2  RBC 5.22 5.38  HGB 15.8 16.3  HCT 48.3 50.0  MCV 92.5 92.9  MCH 30.3 30.3  MCHC 32.7 32.6  RDW 14.0 14.0  PLT 198 192   Cardiac Enzymes Recent Labs  Lab 05/09/18 0159 05/09/18 0740  TROPONINI <0.03 <0.03    Recent Labs  Lab 05/08/18 1036  TROPIPOC 0.00    BNPNo results for input(s): BNP, PROBNP in the last 168 hours.  DDimer No results for input(s): DDIMER in the last 168 hours.  Radiology/Studies:   Dg  Chest 2 View Result Date: 05/08/2018 CLINICAL DATA:  Cough, congestion, palpitations, atrial fibrillation EXAM: CHEST - 2 VIEW COMPARISON:  None FINDINGS: Normal heart size, mediastinal contours, and pulmonary vascularity. Minimal bronchitic changes. Lungs clear. No pulmonary infiltrate, pleural effusion, or pneumothorax. Osseous demineralization with endplate spur formation thoracic spine. IMPRESSION: Minimal bronchitic changes without acute infiltrate. Electronically Signed   By: Ulyses SouthwardMark  Boles M.D.   On: 05/08/2018 09:37    Assessment and Plan:   1. AFib     CHA2DS2Vasc is one, on warfarin with hx of DVT as well.      This is his 1st AFib episode in 7 years, and only his 2nd He feels bad in AFib, even rate controlled. Failed 200J shock x2 in the ER, given his body habitus likely to require more energy  I would 1st attempt DCCV with more energy and follow given his AF has been very infrequent. His INR here has been therapeutic (supratherapeutic), though we do not have any historical/other recent results. He felt like he went into AFib yesterday AM (about 6AM)  I am not certain that it is time for AAD yet. I do not think given his weight that he is an ablation candidate, though early for this as well.  Dr. Johney FrameAllred will see the patient later today   For questions or updates, please contact [facility] Please consult [email or web address] for contact info under Cardiology/STEMI.   Signed, Sheilah PigeonRenee Lynn Ursuy, PA-C  05/09/2018 12:08 PM;  I have seen, examined the patient, and reviewed the above assessment and plan.  Changes to above are made where necessary.  On exam,morbidly obese, iRRR.  The patient has symptomatic persistent atrial fibrillation.  He has  failed cardioversion.  I would therefore advise initiation of AAD therapy at this time.  He is not a candidate for ablation given his obesity.  We discussed flecainide, tikosyn, and amiodarone as options today.  He would like to start flecainide.   INR is therapeutic. I will give flecainide 321m PO x 1 now.  Hopefully he will convert to sinus rhythm.  If he does not, we will start flecainide 100mg  BID tomorrow and proceed with cardioversion on Friday. He should follow-up in AF clinic 3 days after discharge to discuss long term AF management, including lifestyle management and weight loss.  IF he does not have significant weight reduction, our ability to maintain sinus rhythm long term is reduced.  Co Sign: [name], MD 05/09/2018 9:09 PM

## 2018-05-09 NOTE — Progress Notes (Signed)
Called RT and asked for CPAP at bedtime per pt request

## 2018-05-09 NOTE — Progress Notes (Signed)
Called RT and asked for CPAP for bedtime.

## 2018-05-09 NOTE — Discharge Instructions (Addendum)
You have an appointment set up with the Atrial Fibrillation Clinic.  Multiple studies have shown that being followed by a dedicated atrial fibrillation clinic in addition to the standard care you receive from your other physicians improves health. We believe that enrollment in the atrial fibrillation clinic will allow us to better care for you.   The phone number to the Atrial Fibrillation Clinic is [phone]. The clinic is staffed Monday through Friday from 8:30am to 5pm.  Parking Directions: The clinic is located in the Heart and Vascular Building [facility] to [facility]. 1)From [address] turn on to [address] and go to the 3rd entrance  (Heart and Vascular entrance) on the right. 2)Look to the right for Heart &Vascular Parking Garage. 3)A code for the entrance is required, the code for August is 1500.   4)Take the elevators to the 1st floor. Registration is in the room with the glass walls at the end of the hallway.  If you have any trouble parking or locating the clinic, please dont hesitate to call [phone].   Information on my medicine - Coumadin   (Warfarin)  Why was Coumadin prescribed for you? Coumadin was prescribed for you because you have a blood clot or a medical condition that can cause an increased risk of forming blood clots. Blood clots can cause serious health problems by blocking the flow of blood to the heart, lung, or brain. Coumadin can prevent harmful blood clots from forming. As a reminder your indication for Coumadin is:   Stroke Prevention Because Of Atrial Fibrillation  What test will check on my response to Coumadin? While on Coumadin (warfarin) you will need to have an INR test regularly to ensure that your dose is keeping you in the desired range. The INR (international normalized ratio) number is calculated from the result of the laboratory test called prothrombin time (PT).  If an INR APPOINTMENT HAS NOT ALREADY BEEN MADE FOR YOU  please schedule an appointment to have this lab work done by your health care provider within 7 days. Your INR goal is usually a number between:  2 to 3 or your provider may give you a more narrow range like 2-2.5.  Ask your health care provider during an office visit what your goal INR is.  What  do you need to  know  About  COUMADIN? Take Coumadin (warfarin) exactly as prescribed by your healthcare provider about the same time each day.  DO NOT stop taking without talking to the doctor who prescribed the medication.  Stopping without other blood clot prevention medication to take the place of Coumadin may increase your risk of developing a new clot or stroke.  Get refills before you run out.  What do you do if you miss a dose? If you miss a dose, take it as soon as you remember on the same day then continue your regularly scheduled regimen the next day.  Do not take two doses of Coumadin at the same time.  Important Safety Information A possible side effect of Coumadin (Warfarin) is an increased risk of bleeding. You should call your healthcare provider right away if you experience any of the following: ? Bleeding from an injury or your nose that does not stop. ? Unusual colored urine (red or dark brown) or unusual colored stools (red or black). ? Unusual bruising for unknown reasons. ? A serious fall or if you hit your head (even if there is no bleeding).  Some foods or medicines  interact with Coumadin (warfarin) and might alter your response to warfarin. To help avoid this: ? Eat a balanced diet, maintaining a consistent amount of Vitamin K. ? Notify your provider about major diet changes you plan to make. ? Avoid alcohol or limit your intake to 1 drink for women and 2 drinks for men per day. (1 drink is 5 oz. wine, 12 oz. beer, or 1.5 oz. liquor.)  Make sure that ANY health care provider who prescribes medication for you knows that you are taking Coumadin (warfarin).  Also make sure the  healthcare provider who is monitoring your Coumadin knows when you have started a new medication including herbals and non-prescription products.  Coumadin (Warfarin)  Major Drug Interactions  Increased Warfarin Effect Decreased Warfarin Effect  Alcohol (large quantities) Antibiotics (esp. Septra/Bactrim, Flagyl, Cipro) Amiodarone (Cordarone) Aspirin (ASA) Cimetidine (Tagamet) Megestrol (Megace) NSAIDs (ibuprofen, naproxen, etc.) Piroxicam (Feldene) Propafenone (Rythmol SR) Propranolol (Inderal) Isoniazid (INH) Posaconazole (Noxafil) Barbiturates (Phenobarbital) Carbamazepine (Tegretol) Chlordiazepoxide (Librium) Cholestyramine (Questran) Griseofulvin Oral Contraceptives Rifampin Sucralfate (Carafate) Vitamin K   Coumadin (Warfarin) Major Herbal Interactions  Increased Warfarin Effect Decreased Warfarin Effect  Garlic Ginseng Ginkgo biloba Coenzyme Q10 Green tea St. Johns wort    Coumadin (Warfarin) FOOD Interactions  Eat a consistent number of servings per week of foods HIGH in Vitamin K (1 serving =  cup)  Collards (cooked, or boiled & drained) Kale (cooked, or boiled & drained) Mustard greens (cooked, or boiled & drained) Parsley *serving size only =  cup Spinach (cooked, or boiled & drained) Swiss chard (cooked, or boiled & drained) Turnip greens (cooked, or boiled & drained)  Eat a consistent number of servings per week of foods MEDIUM-HIGH in Vitamin K (1 serving = 1 cup)  Asparagus (cooked, or boiled & drained) Broccoli (cooked, boiled & drained, or raw & chopped) Brussel sprouts (cooked, or boiled & drained) *serving size only =  cup Lettuce, raw (green leaf, endive, romaine) Spinach, raw Turnip greens, raw & chopped   These websites have more information on Coumadin (warfarin):  http://www.king-russell.com/; https://www.hines.net/;

## 2018-05-09 NOTE — Progress Notes (Signed)
ANTICOAGULATION CONSULT NOTE - Follow Up Consult  Pharmacy Consult for Coumadin Indication: atrial fibrillation  Allergies  Allergen Reactions  . Diphenhydramine Hcl Anaphylaxis    Kidney pain Kidney pain Kidney pain   . Prednisone Palpitations    Patient Measurements: Height: 5\' 11"  (180.3 cm) Weight: (!) 392 lb (177.8 kg) IBW/kg (Calculated) : 75.3  Vital Signs: Temp: 98 F (36.7 C) (08/07 0451) Temp Source: Oral (08/07 0451) BP: 118/74 (08/07 0451) Pulse Rate: 92 (08/07 0451)  Labs: Recent Labs    05/08/18 1018 05/09/18 0159 05/09/18 0740  HGB 15.8  --  16.3  HCT 48.3  --  50.0  PLT 198  --  192  LABPROT 31.6* 35.9* 37.9*  INR 3.09 3.63 3.89  CREATININE 0.83 0.89 0.94  TROPONINI  --  <0.03 <0.03    Estimated Creatinine Clearance: 147.8 mL/min (by C-G formula based on SCr of 0.94 mg/dL).  Assessment:  Anticoag:  Warfarin PTA for afib/DVTs (last dose 8/6). INR 3.89 up today. CBC WNL. - Home regimen: 5mg  daily except for 10mg  Wed, Fri, Sun - Admit 8/6 INR 3.09  Goal of Therapy:  INR 2-3 Monitor platelets by anticoagulation protocol: Yes   Plan:  Hold Coumadin again tonight. Dailiy INR   Joshua Mcgee S. Merilynn Finlandobertson, PharmD, BCPS Clinical Staff Pharmacist Pager [phone]  Joshua Stanleyobertson, Joshua Mcgee 05/09/2018,11:13 AM

## 2018-05-09 NOTE — Plan of Care (Signed)
  Problem: Coping: Goal: Level of anxiety will decrease Outcome: Progressing   Problem: Nutrition: Goal: Adequate nutrition will be maintained Outcome: Progressing   

## 2018-05-09 NOTE — Progress Notes (Signed)
  Echocardiogram 2D Echocardiogram has been performed.  Joshua Mcgee, Joshua Mcgee 05/09/2018, 10:28 AM

## 2018-05-10 LAB — PROTIME-INR
INR: 2.72
PROTHROMBIN TIME: 28.7 s — AB (ref 11.4–15.2)

## 2018-05-10 MED ORDER — FLECAINIDE ACETATE 100 MG PO TABS: 300.0000 mg | ORAL_TABLET | Freq: Once | ORAL | 0 refills | Status: DC | PRN

## 2018-05-10 NOTE — Care Management (Signed)
05-10-18  BENEFITS CHECK:  # 4.  [facility]W [facility] [phone]   1. TAMBOCOR  300 MG DAILY COVER- NOT COVER PRIOR APPROVAL- YES [phone]  2. FLECAINIDE  150 MG BID   (300 mg-none formulary ) COVER- YES CO-PAY- $ 16.00 TIER- 1 DRUG PRIOR APPROVAL- NO  PREFERRED PHARMACY : YES [facility] 90 DAY SUPPL[facility] AT RETAIL  $ 48.00

## 2018-05-10 NOTE — Plan of Care (Signed)
  Problem: Clinical Measurements: Goal: Respiratory complications will improve Outcome: Progressing   Problem: Clinical Measurements: Goal: Cardiovascular complication will be avoided Outcome: Progressing   

## 2018-05-10 NOTE — Progress Notes (Signed)
Pt has converted to sinus rhythm with flecainide 300mg  PO x 1.  Feels better.  Telemetry, ekg are stable.  DC to home today. He will take flecainide 300mg  PO x 1 as "pill in pocket' for afib going forward.  Follow-up in AF clinic in 3-5 days  Lifestyle modification encouraged.  Hillis RangeJames Antonius Hartlage MD, [facility] 05/10/2018 7:51 AM

## 2018-05-10 NOTE — Plan of Care (Signed)
  Problem: Education: Goal: Knowledge of General Education information will improve Description Including pain rating scale, medication(s)/side effects and non-pharmacologic comfort measures Outcome: Completed/Met   Problem: Activity: Goal: Risk for activity intolerance will decrease Outcome: Completed/Met   Problem: Nutrition: Goal: Adequate nutrition will be maintained Outcome: Completed/Met   Problem: Coping: Goal: Level of anxiety will decrease Outcome: Completed/Met   Problem: Pain Managment: Goal: General experience of comfort will improve Outcome: Completed/Met   Problem: Safety: Goal: Ability to remain free from injury will improve Outcome: Completed/Met   Problem: Skin Integrity: Goal: Risk for impaired skin integrity will decrease Outcome: Completed/Met

## 2018-05-10 NOTE — Progress Notes (Signed)
Educated pt on price of medications, pt understanding. Pt discharge education provided at bedside. Pt has all belongings. Pt IV and telemetry removed by NT. Pt discharged via wheelchair with NT

## 2018-05-10 NOTE — Discharge Summary (Addendum)
DISCHARGE SUMMARY    Patient ID: Joshua Mcgee,  MRN: [id number], DOB/AGE: 54-Feb-1967 54 y.o.  Admit date: 05/08/2018 Discharge date: 05/10/2018  Primary Care Physician: [name], MD Primary Cardiologist: Dr. Fayrene Fearing ([facility]), he would like to transfer to [facility], Dr. Mayford Knife Electrophysiologist: new to Dr. Johney Frame this admission  Primary Discharge Diagnosis:  1. Paroxysmal AFib     CHA2DS2Vasc is one, on warfarin, monitored and managed via his PMD/coumadin clinic  Secondary Discharge Diagnosis:  1. Recurrent DVT history     Pt reports recommended lifelong warfarin tx 2. HTN 3. Morbid obesity 4. OSA w/CPAP (reported compliance)  Allergies  Allergen Reactions  . Diphenhydramine Hcl Anaphylaxis    Kidney pain Kidney pain Kidney pain   . Prednisone Palpitations     Procedures This Admission:  1. 05/08/18: Unsuccessful DCCV attempts in ER (x2)  Brief HPI: Joshua Mcgee is a 52 y.o. male was admitted to [facility] with c/o sudden onset palpitations associated with generalized weakness, no CP, no near syncope or syncope, found in rate controlled AFib with known history.   He denied any SOB, cough/cold symptoms, no fever or recent symptoms of illness. Attempts in ER to cardiovert him were unsuccessful (x2 shocks).  The patient did not want to discharge in AFib, cardiology admitted for further management.  Hospital Course:  The patient was admitted his labs unremarkable, his INR on admission 3.09.  BP was stable, HR remained controlled.  EP was consulted for rhythm management options/stratgies.   The patient reported his 1st episode of AF was 7 years ago, knew immediately something was off, suddenly felt like all his energy was drained.  He was found in AFib, cardioverted, and started on atenolol.  He stated over the years he has had momentary palpitations, but nothing at all persistent until yesterday, and knew again that he had gone out of rhythm.  He reported compliance with his warfarin.  He  had TTE noting LVEF 55-60%, technically difficult study, no WMA , no significant valvular abnormalities.  He has no known CAD, and hx of stress echo in 2016 that was negative of any evidence of ischemia.  He was given 300mg  Flecainide and did subsequently convert to SR.  He remains in SR 70's-80's.  Given the infrequency of his AF, recommend Flecainide as a "pill in the pocket" regime.  He has early follow up with the AFib clinic on Monday. He is instructed to continue his Atenolol and not to stop it without consulting the AFib clinic or cardiologist.  The patient feels well this morning, no CP, no SOB, no cough, no palpitations, feels a little anxious/worried about having more AFib.  He was seen and examined by Dr. Johney Frame and considered stable for discharge to home.      Physical Exam: Vitals:   05/09/18 1138 05/09/18 2037 05/09/18 2348 05/10/18 0357  BP: 95/79 115/81 113/80 110/84  Pulse: 85 92 87 85  Resp: 20 18 18 18   Temp: 97.9 F (36.6 C) 98.4 F (36.9 C) 98.4 F (36.9 C) 97.9 F (36.6 C)  TempSrc: Oral Oral Oral Oral  SpO2: 97% 95% 95% 94%  Weight:    (!) 177 kg  Height:        GEN- The patient is well appearing, alert and oriented x 3 today.   HEENT: normocephalic, atraumatic; sclera clear, conjunctiva pink; hearing intact; oropharynx clear; neck supple, no JVP Lungs- CTA b/l, normal work of breathing.  No wheezes, rales, rhonchi Heart- Regular  rate and rhythm, no murmurs, rubs or gallops, PMI not laterally displaced GI- soft, non-tender, non-distended, bowel sounds present, no hepatosplenomegaly Extremities- no clubbing, cyanosis, or edema; DP/PT/radial pulses 2+ bilaterally MS- no significant deformity or atrophy Skin- warm and dry, no rash or lesion, left chest without hematoma/ecchymosis Psych- euthymic mood, full affect Neuro- no gross deficits   Labs:   Lab Results  Component Value Date   WBC 7.2 05/09/2018   HGB 16.3 05/09/2018   HCT 50.0 05/09/2018   MCV 92.9  05/09/2018   PLT 192 05/09/2018    Recent Labs  Lab 05/08/18 1018  05/09/18 0740  NA 141   < > 141  K 4.4   < > 4.4  CL 108   < > 105  CO2 22   < > 26  BUN 9   < > 9  CREATININE 0.83   < > 0.94  CALCIUM 9.1   < > 8.8*  PROT 6.5  --   --   BILITOT 1.7*  --   --   ALKPHOS 74  --   --   ALT 44  --   --   AST 29  --   --   GLUCOSE 109*   < > 111*   < > = values in this interval not displayed.    Discharge Medications:  Allergies as of 05/10/2018      Reactions   Diphenhydramine Hcl Anaphylaxis   Kidney pain Kidney pain Kidney pain   Prednisone Palpitations      Medication List    TAKE these medications   albuterol 108 (90 Base) MCG/ACT inhaler Commonly known as:  PROVENTIL HFA;VENTOLIN HFA Inhale 2 puffs into the lungs every 4 (four) hours as needed.   atenolol 50 MG tablet Commonly known as:  TENORMIN Take 50 mg by mouth 2 (two) times daily. Notes to patient:  Continue this medicine as we discussed.  Do NOT stop taking this medication on your own.     DULoxetine 30 MG capsule Commonly known as:  CYMBALTA Take 30 mg by mouth daily.   flecainide 100 MG tablet Commonly known as:  TAMBOCOR Take 3 tablets (300 mg total) by mouth Once PRN (Take only if you have atrial fibrillation). Do not repeat dose within 24 hours.   omeprazole 20 MG capsule Commonly known as:  PRILOSEC Take 20 mg by mouth daily.   valsartan 160 MG tablet Commonly known as:  DIOVAN Take 160 mg by mouth daily.   warfarin 10 MG tablet Commonly known as:  COUMADIN Take 10 mg by mouth See admin instructions. Take 5 mg Mon. Tues. Thurs and, Sat   Take 10 mg  Wed. Sun. Fri  Take in the morning Notes to patient:  Continue your home regime, please call your usual coumadin clinic for INR/lab check to be done within a week.       Disposition:  Discharge Instructions    Diet - low sodium heart healthy   Complete by:  As directed    Increase activity slowly   Complete by:  As directed       Follow-up Information    [facility] Follow up on 05/14/2018.   Specialty:  Cardiology Why:  11:00AM Contact information: [address] [id number] [place] [id number] [phone]       Jolene ProvostHaimes, David M, MD Follow up.   Specialty:  Family Medicine Why:  Call your coumadin clinic for INR check to be  done within 1 week Contact information: [address] [place] [address] [phone]        [name], MD Follow up on 06/27/2018.   Specialty:  Cardiology Why:  3:30PM Contact information: [address] [place] [address] [phone]           Duration of Discharge Encounter: Greater than 30 minutes including physician time.  Norma Fredrickson, PA-C 05/10/2018 10:55 AM

## 2018-05-10 NOTE — Care Management (Signed)
05-10-18  BENEFITS CHECK:  S/W  KEELEY @ [facility] # [phone]  JARDIANCE  10 MG  DAILY COVER- YES CO-PAY- $ 47.00 TIER- NO PRIOR APPROVAL- NO   NOVOLOG INSULIN FLEX PEN  (no dosage ) COVER- YES CO-PAY- $ 47.00 TIER- NO PRIOR APPROVAL-NO  LANTUS SOLORSTAR PEN ( no dosage ) COVER- NOT COVER PRIOR APPROVAL- YES # [phone]  ALTERNATIVE : ( all are cover ) 1.BASAGLART 2. LEVEMIR 3. TRESIBA  PREFERRED PHARMACY : YES    [facility]

## 2018-05-14 ENCOUNTER — Ambulatory Visit (HOSPITAL_COMMUNITY)
Admission: RE | Admit: 2018-05-14 | Discharge: 2018-05-14 | Disposition: A | Discharge status: Home / Self Care | Payer: BC Managed Care – PPO | Source: Ambulatory Visit | Attending: Nurse Practitioner | Admitting: Nurse Practitioner

## 2018-05-14 ENCOUNTER — Encounter (HOSPITAL_COMMUNITY): Payer: Self-pay | Admitting: Nurse Practitioner

## 2018-05-14 VITALS — BP 108/68 | HR 56 | Ht 71.0 in | Wt 394.0 lb

## 2018-05-14 DIAGNOSIS — Z8249 Family history of ischemic heart disease and other diseases of the circulatory system: Secondary | ICD-10-CM | POA: Diagnosis not present

## 2018-05-14 DIAGNOSIS — Z87891 Personal history of nicotine dependence: Secondary | ICD-10-CM | POA: Insufficient documentation

## 2018-05-14 DIAGNOSIS — Z79899 Other long term (current) drug therapy: Secondary | ICD-10-CM | POA: Insufficient documentation

## 2018-05-14 DIAGNOSIS — Z7901 Long term (current) use of anticoagulants: Secondary | ICD-10-CM | POA: Insufficient documentation

## 2018-05-14 DIAGNOSIS — G4733 Obstructive sleep apnea (adult) (pediatric): Secondary | ICD-10-CM | POA: Insufficient documentation

## 2018-05-14 DIAGNOSIS — I10 Essential (primary) hypertension: Secondary | ICD-10-CM | POA: Insufficient documentation

## 2018-05-14 DIAGNOSIS — Z888 Allergy status to other drugs, medicaments and biological substances status: Secondary | ICD-10-CM | POA: Diagnosis not present

## 2018-05-14 DIAGNOSIS — Z833 Family history of diabetes mellitus: Secondary | ICD-10-CM | POA: Diagnosis not present

## 2018-05-14 DIAGNOSIS — Z6841 Body Mass Index (BMI) 40.0 and over, adult: Secondary | ICD-10-CM | POA: Insufficient documentation

## 2018-05-14 DIAGNOSIS — I480 Paroxysmal atrial fibrillation: Secondary | ICD-10-CM

## 2018-05-14 MED ORDER — FLECAINIDE ACETATE 100 MG PO TABS: 300.0000 mg | ORAL_TABLET | Freq: Once | ORAL | 0 refills | Status: DC | PRN

## 2018-05-14 NOTE — Progress Notes (Signed)
Primary Care Physician: Jolene ProvostHaimes, David M, MD [facility]   Joshua Mcgee is a 52 y.o. male with a h/o paroxysmal afib quiet for 7 years, with recent ER visit for same. He was unsuccessfully shocked x 2 in the ER and the pt did not want to leave in afib, so he was admitted  He was given pill in pocket flecainide 300 mg x one dose.This did convert pt. He is in SR today. He is on lifelong warfarin for DVT. He does  wear CPAP , denies any significant caffeine or alcohol use. Is fairly inactive as for regular exercise. Works in [employer].   Today, he denies symptoms of palpitations, chest pain, shortness of breath, orthopnea, PND, lower extremity edema, dizziness, presyncope, syncope, or neurologic sequela. The patient is tolerating medications without difficulties and is otherwise without complaint today.   Past Medical History:  Diagnosis Date  . Atrial fibrillation (HCC)    on Coumadin  . Chronic anticoagulation   . Depression   . Hypertension   . OSA on CPAP    No past surgical history on file.  Current Outpatient Medications  Medication Sig Dispense Refill  . albuterol (PROVENTIL HFA;VENTOLIN HFA) 108 (90 Base) MCG/ACT inhaler Inhale 2 puffs into the lungs every 4 (four) hours as needed.    Marland Kitchen. atenolol (TENORMIN) 50 MG tablet Take 50 mg by mouth 2 (two) times daily.    . DULoxetine (CYMBALTA) 30 MG capsule Take 30 mg by mouth daily.    Marland Kitchen. omeprazole (PRILOSEC) 20 MG capsule Take 20 mg by mouth daily.    . valsartan (DIOVAN) 160 MG tablet Take 160 mg by mouth daily.    Marland Kitchen. warfarin (COUMADIN) 10 MG tablet Take 10 mg by mouth See admin instructions. Take 5 mg Mon. Tues. Thurs and, Sat   Take 10 mg  Wed. Sun. Fri  Take in the morning    . flecainide (TAMBOCOR) 100 MG tablet Take 3 tablets (300 mg total) by mouth Once PRN (Take only if you have atrial fibrillation). Do not repeat dose within 4 days. 12 tablet 0   No current facility-administered medications for this  encounter.     Allergies  Allergen Reactions  . Diphenhydramine Hcl Anaphylaxis    Kidney pain Kidney pain Kidney pain   . Prednisone Palpitations    Social History   Socioeconomic History  . Marital status: Unknown    Spouse name: Not on file  . Number of children: Not on file  . Years of education: Not on file  . Highest education level: Not on file  Occupational History  . Not on file  Social Needs  . Financial resource strain: Not on file  . Food insecurity:    Worry: Not on file    Inability: Not on file  . Transportation needs:    Medical: Not on file    Non-medical: Not on file  Tobacco Use  . Smoking status: Former Games developermoker  . Smokeless tobacco: Current User  Substance and Sexual Activity  . Alcohol use: Not Currently  . Drug use: Never  . Sexual activity: Not on file  Lifestyle  . Physical activity:    Days per week: Not on file    Minutes per session: Not on file  . Stress: Not on file  Relationships  . Social connections:    Talks on phone: Not on file    Gets together: Not on file    Attends religious service: Not on  file    Active member of club or organization: Not on file    Attends meetings of clubs or organizations: Not on file    Relationship status: Not on file  . Intimate partner violence:    Fear of current or ex partner: Not on file    Emotionally abused: Not on file    Physically abused: Not on file    Forced sexual activity: Not on file  Other Topics Concern  . Not on file  Social History Narrative  . Not on file    Family History  Problem Relation Age of Onset  . Heart disease Mother   . Diabetes Mother     ROS- All systems are reviewed and negative except as per the HPI above  Physical Exam: Vitals:   05/14/18 1048  BP: 108/68  Pulse: (!) 56  Weight: (!) 178.7 kg  Height: 5\' 11"  (1.803 m)   Wt Readings from Last 3 Encounters:  05/14/18 (!) 178.7 kg  05/10/18 (!) 177 kg    Labs: Lab Results  Component Value  Date   NA 141 05/09/2018   K 4.4 05/09/2018   CL 105 05/09/2018   CO2 26 05/09/2018   GLUCOSE 111 (H) 05/09/2018   BUN 9 05/09/2018   CREATININE 0.94 05/09/2018   CALCIUM 8.8 (L) 05/09/2018   MG 2.1 05/08/2018   Lab Results  Component Value Date   INR 2.72 05/10/2018   No results found for: CHOL, HDL, LDLCALC, TRIG   GEN- The patient is well appearing, alert and oriented x 3 today.   Head- normocephalic, atraumatic Eyes-  Sclera clear, conjunctiva pink Ears- hearing intact Oropharynx- clear Neck- supple, no JVP Lymph- no cervical lymphadenopathy Lungs- Clear to ausculation bilaterally, normal work of breathing Heart- Regular rate and rhythm, no murmurs, rubs or gallops, PMI not laterally displaced GI- soft, NT, ND, + BS Extremities- no clubbing, cyanosis, or edema MS- no significant deformity or atrophy Skin- no rash or lesion Psych- euthymic mood, full affect Neuro- strength and sensation are intact  EKG-Sinus brady at 56 bpm, PR int 172 ms, qrs int 94 ms, qtc 399 ms Epic records reviewed Echo-Study Conclusions  - Left ventricle: The cavity size was normal. Wall thickness was   increased in a pattern of mild LVH. Systolic function was normal.   The estimated ejection fraction was in the range of 55% to 60%.   Although no diagnostic regional wall motion abnormality was   identified, this possibility cannot be completely excluded on the   basis of this study. The study was not technically sufficient to   allow evaluation of LV diastolic dysfunction due to atrial   fibrillation. - Aortic valve: There was no stenosis. - Mitral valve: Mildly calcified annulus. There was no significant   regurgitation. - Right ventricle: Poorly visualized. The cavity size was normal.   Systolic function was normal. - Pulmonary arteries: No complete TR doppler jet so unable to   estimate PA systolic pressure. - Systemic veins: IVC poorly visualized.  Impressions:  - Technically  difficult study with poor acoustic windows. Normal LV   size with mild LV hypertrophy. EF 55-60%. Normal RV size and   systolic function. No significant valvular abnormalities.    Assessment and Plan: 1. Paroxysmal afib  Quiet x 7 years Unsuccessful ER cardioversion but successful chemical conversion with flecainide 300 mg pill in pocket  Reviewed how to use flecainide at home if this reoccurs I would not recommend repeating the 300  mg dose any more often than once  every 4 days Continue Atenolol at current dose Continue to use cpap  2. Chadsvasc score of at least 1 Is on lifelong warfarin for h/o DVT's Followed by his [facility] cardiologist office   3. BMI of 54.95 Encouraged weight loss by diet modification Weight loss class encouraged  Regular exercise program encouraged Morbid obesity will greatly increase afib burden over time  F/u with Dr. Elberta Fortis 9/25 F/u with [facility] cardiologist as scheduled   Lupita Leash C. [facility] [address] [place], [place] [address] [phone]

## 2018-06-27 ENCOUNTER — Ambulatory Visit: Payer: BC Managed Care – PPO | Admitting: Cardiology

## 2018-07-16 ENCOUNTER — Ambulatory Visit: Payer: BC Managed Care – PPO | Admitting: Cardiology

## 2018-09-10 ENCOUNTER — Ambulatory Visit: Payer: BC Managed Care – PPO | Admitting: Cardiology

## 2018-10-08 ENCOUNTER — Encounter: Payer: Self-pay | Admitting: Cardiology

## 2018-10-08 ENCOUNTER — Encounter: Payer: Self-pay | Admitting: *Deleted

## 2018-10-08 ENCOUNTER — Other Ambulatory Visit: Payer: Self-pay | Admitting: *Deleted

## 2018-10-08 ENCOUNTER — Ambulatory Visit: Payer: BC Managed Care – PPO | Admitting: Cardiology

## 2018-10-08 VITALS — BP 124/74 | HR 76 | Ht 71.0 in | Wt >= 6400 oz

## 2018-10-08 DIAGNOSIS — I480 Paroxysmal atrial fibrillation: Secondary | ICD-10-CM

## 2018-10-08 DIAGNOSIS — G4733 Obstructive sleep apnea (adult) (pediatric): Secondary | ICD-10-CM

## 2018-10-08 NOTE — Progress Notes (Signed)
Electrophysiology Office Note   Date:  10/08/2018   ID:  Joshua Mcgee, DOB Oct 30, 1965, MRN [id number]  PCP:  [name], MD  Cardiologist:  [name] Primary Electrophysiologist:  [name], MD    No chief complaint on file.    History of Present Illness: Joshua Mcgee is a 53 y.o. male who is being seen today for the evaluation of atrial fibrillation at the request of [name]. Presenting today for electrophysiology evaluation.  He has a history of hypertension, morbid obesity, OSA on CPAP, and atrial fibrillation on Coumadin.  He was seen in the emergency room 05/10/2018 with 2 unsuccessful attempts at cardioversion.  He was given 300 mg of flecainide and subsequently converted to sinus rhythm.  He was chosen to have a flecainide pill in the pocket regimen.  Today, he denies symptoms of palpitations, chest pain, shortness of breath, orthopnea, PND, lower extremity edema, claudication, dizziness, presyncope, syncope, bleeding, or neurologic sequela. The patient is tolerating medications without difficulties.    Past Medical History:  Diagnosis Date  . Atrial fibrillation (HCC)    on Coumadin  . Chronic anticoagulation   . Depression   . Hypertension   . OSA on CPAP    History reviewed. No pertinent surgical history.   Current Outpatient Medications  Medication Sig Dispense Refill  . albuterol (PROVENTIL HFA;VENTOLIN HFA) 108 (90 Base) MCG/ACT inhaler Inhale 2 puffs into the lungs every 4 (four) hours as needed.    Marland Kitchen atenolol (TENORMIN) 50 MG tablet Take 50 mg by mouth 2 (two) times daily.    Marland Kitchen atorvastatin (LIPITOR) 10 MG tablet Take 10 mg by mouth daily.    . flecainide (TAMBOCOR) 100 MG tablet Take 3 tablets (300 mg total) by mouth Once PRN (Take only if you have atrial fibrillation). Do not repeat dose within 4 days. 12 tablet 0  . gabapentin (NEURONTIN) 300 MG capsule Take 300 mg by mouth 2 (two) times daily.    Marland Kitchen omeprazole (PRILOSEC) 20 MG capsule Take 20  mg by mouth daily.    Marland Kitchen PARoxetine (PAXIL) 40 MG tablet Take 60 mg by mouth daily.    . valsartan (DIOVAN) 160 MG tablet Take 160 mg by mouth daily.    Marland Kitchen warfarin (COUMADIN) 10 MG tablet Take 10 mg by mouth See admin instructions. Take 5 mg Mon. Tues. Thurs and, Sat   Take 10 mg  Wed. Sun. Fri  Take in the morning     No current facility-administered medications for this visit.     Allergies:   Diphenhydramine hcl and Prednisone   Social History:  The patient  reports that he has quit smoking. He uses smokeless tobacco. He reports previous alcohol use. He reports that he does not use drugs.   Family History:  The patient's family history includes Diabetes in his mother; Heart disease in his mother.    ROS:  Please see the history of present illness.   Otherwise, review of systems is positive for none.   All other systems are reviewed and negative.    PHYSICAL EXAM: VS:  BP 124/74   Pulse 76   Ht 5\' 11"  (1.803 m)   Wt (!) 422 lb (191.4 kg)   BMI 58.86 kg/m  , BMI Body mass index is 58.86 kg/m. GEN: Well nourished, well developed, in no acute distress  HEENT: normal  Neck: no JVD, carotid bruits, or masses Cardiac: RRR; no murmurs, rubs, or gallops,no edema  Respiratory:  clear to  auscultation bilaterally, normal work of breathing GI: soft, nontender, nondistended, + BS MS: no deformity or atrophy  Skin: warm and dry Neuro:  Strength and sensation are intact Psych: euthymic mood, full affect  EKG:  EKG is ordered today. Personal review of the ekg ordered shows SR, rate 76, inferior Q waves  Recent Labs: 05/08/2018: ALT 44; Magnesium 2.1 05/09/2018: BUN 9; Creatinine, Ser 0.94; Hemoglobin 16.3; Platelets 192; Potassium 4.4; Sodium 141; TSH 0.974    Lipid Panel  No results found for: CHOL, TRIG, HDL, CHOLHDL, VLDL, LDLCALC, LDLDIRECT   Wt Readings from Last 3 Encounters:  10/08/18 (!) 422 lb (191.4 kg)  05/14/18 (!) 394 lb (178.7 kg)  05/10/18 (!) 390 lb 3.2 oz (177  kg)      Other studies Reviewed: Additional studies/ records that were reviewed today include: TTE 05/09/18  Review of the above records today demonstrates:  - Left ventricle: The cavity size was normal. Wall thickness was   increased in a pattern of mild LVH. Systolic function was normal.   The estimated ejection fraction was in the range of 55% to 60%.   Although no diagnostic regional wall motion abnormality was   identified, this possibility cannot be completely excluded on the   basis of this study. The study was not technically sufficient to   allow evaluation of LV diastolic dysfunction due to atrial   fibrillation. - Aortic valve: There was no stenosis. - Mitral valve: Mildly calcified annulus. There was no significant   regurgitation. - Right ventricle: Poorly visualized. The cavity size was normal.   Systolic function was normal. - Pulmonary arteries: No complete TR doppler jet so unable to   estimate PA systolic pressure. - Systemic veins: IVC poorly visualized.   ASSESSMENT AND PLAN:  1.  Paroxysmal atrial fibrillation: Has flecainide for a pill in the pocket regimen.  He is on lifelong Coumadin for history of DVTs.  He has had no further episodes of atrial fibrillation.  He is currently feeling well.  No changes.  This patients CHA2DS2-VASc Score and unadjusted Ischemic Stroke Rate (% per year) is equal to 0.6 % stroke rate/year from a score of 1  Above score calculated as 1 point each if present [CHF, HTN, DM, Vascular=MI/PAD/Aortic Plaque, Age if 65-74, or Male] Above score calculated as 2 points each if present [Age > 75, or Stroke/TIA/TE]  2.  Obstructive sleep apnea: CPAP compliance encouraged  3.  Morbid obesity: Diet and exercise encouraged  Current medicines are reviewed at length with the patient today.   The patient does not have concerns regarding his medicines.  The following changes were made today:  none  Labs/ tests ordered today include:  Orders  Placed This Encounter  Procedures  . EKG 12-Lead     Disposition:   FU with Faiga Stones 1 year  Signed, Harman Langhans Jorja LoaMartin Osborne Serio, MD  10/08/2018 3:06 PM     [facility] [address] [place] [id number] [phone] (office) [phone] (fax)

## 2018-10-08 NOTE — Patient Instructions (Addendum)
Medication Instructions:  Your physician recommends that you continue on your current medications as directed. Please refer to the Current Medication list given to you today.  * If you need a refill on your cardiac medications before your next appointment, please call your pharmacy.   Labwork: None ordered  Testing/Procedures: None ordered  Follow-Up: Your physician wants you to follow-up in: 1 year with Dr. Camnitz.  You will receive a reminder letter in the mail two months in advance. If you don't receive a letter, please call our office to schedule the follow-up appointment.  Thank you for choosing [facility]!!   Zayvier Caravello, RN [phone]        

## 2018-10-29 ENCOUNTER — Telehealth: Payer: Self-pay | Admitting: Cardiology

## 2018-10-29 NOTE — Telephone Encounter (Signed)
New Message   Pt c/o medication issue:  1. Name of Medication: Dayquil/ Nyquil  2. How are you currently taking this medication (dosage and times per day)?   3. Are you having a reaction (difficulty breathing--STAT)?   4. What is your medication issue? Patient is wanting to know can he take Dayquil or Nyquil for a cold since he has Afib. Please call to discuss.

## 2018-10-29 NOTE — Telephone Encounter (Signed)
Has a stimulant that can certainly worsen atrial fibrillation, though if he continues to have symptoms, would be worth a try.

## 2018-10-29 NOTE — Telephone Encounter (Signed)
Pt advised and verbalized understanding.  Pt states he is just going to take the Coricidin w/ heart on the box and see how that does. Pt will call office if he has further questions or he has to take something and his afib is exacerbated.

## 2019-01-19 IMAGING — CR DG CHEST 2V
2 series · 2 of 2 positions shown · non-contrast
Comparison: None

CLINICAL DATA: Cough, congestion, palpitations, atrial fibrillation

EXAM:
CHEST - 2 VIEW

[chest lat]
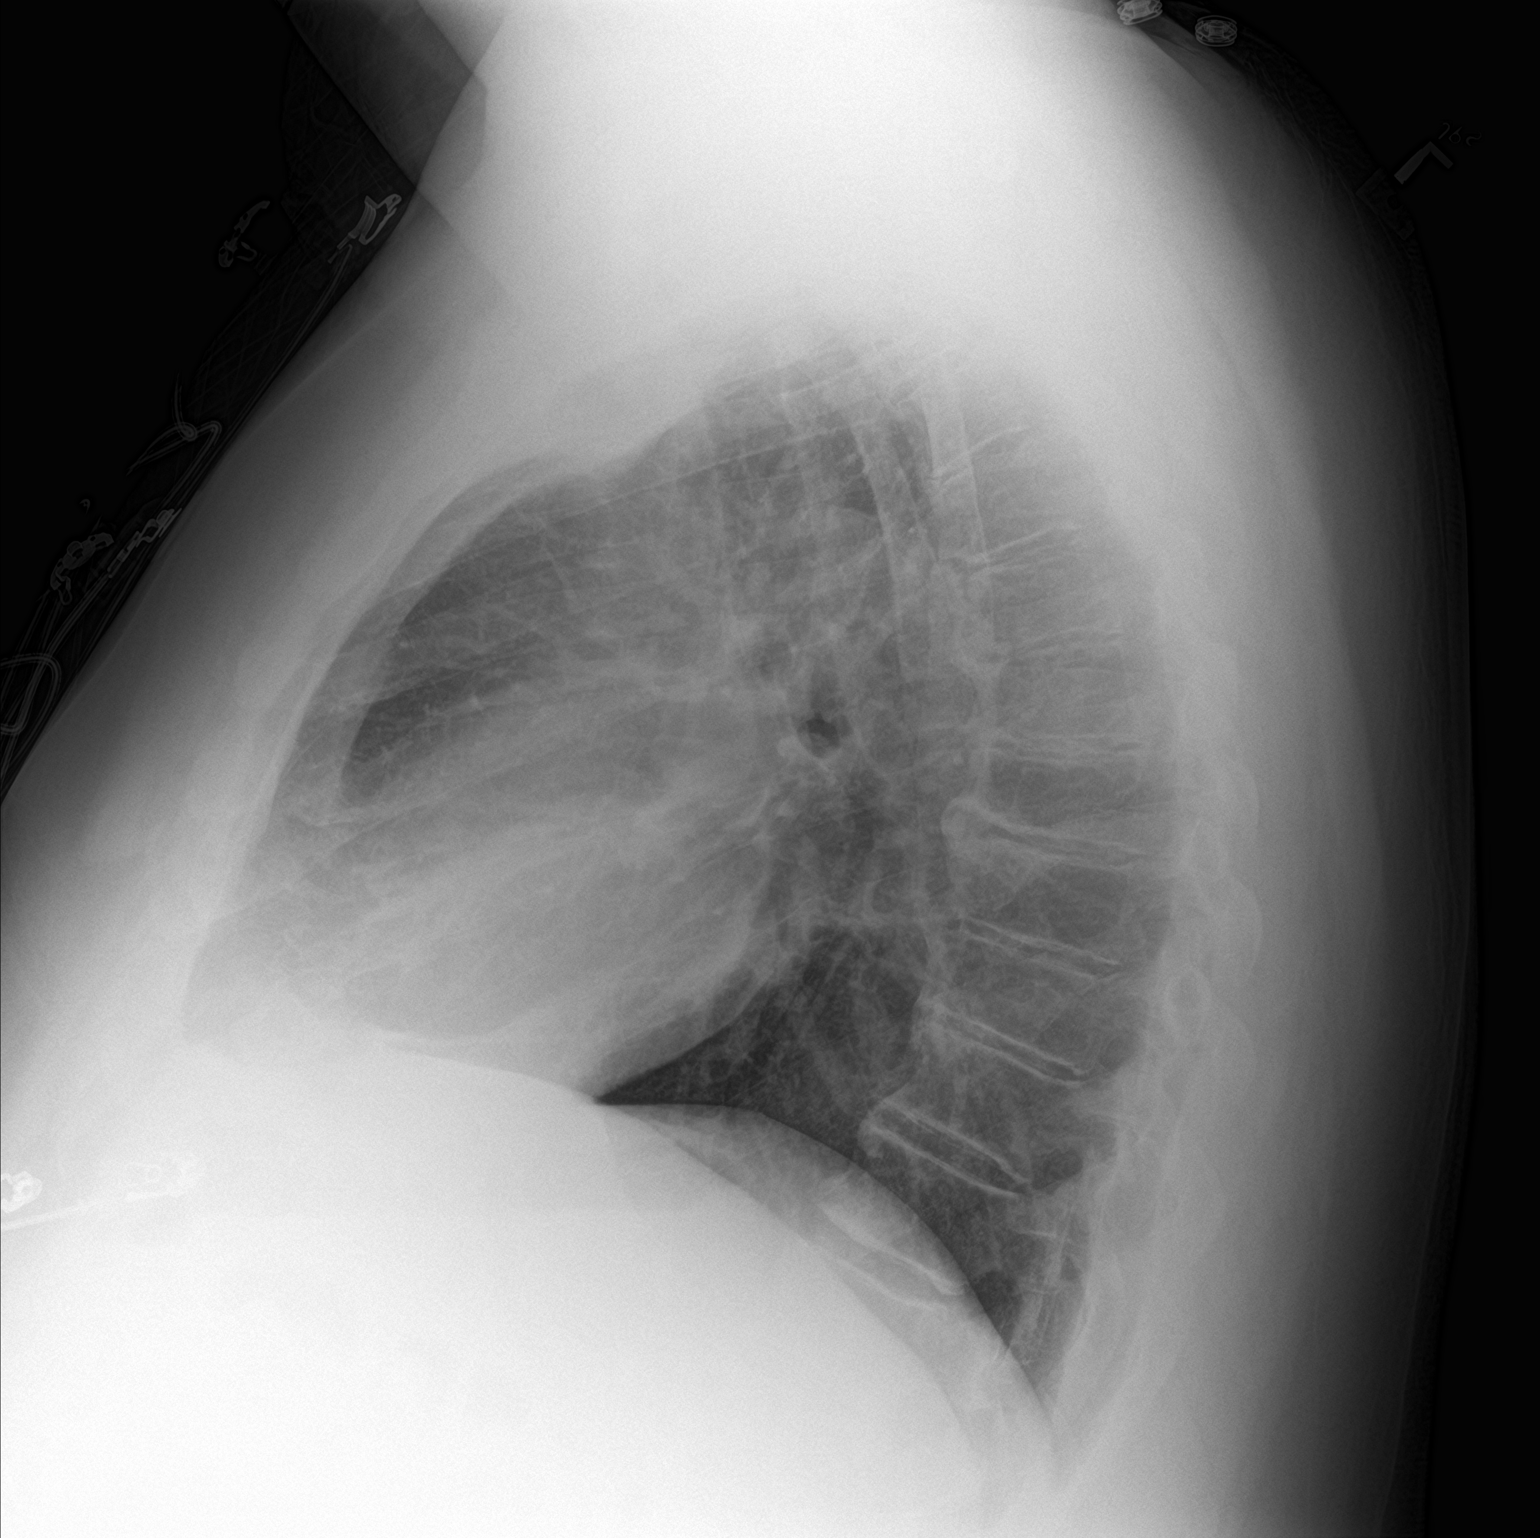

[chest pa]
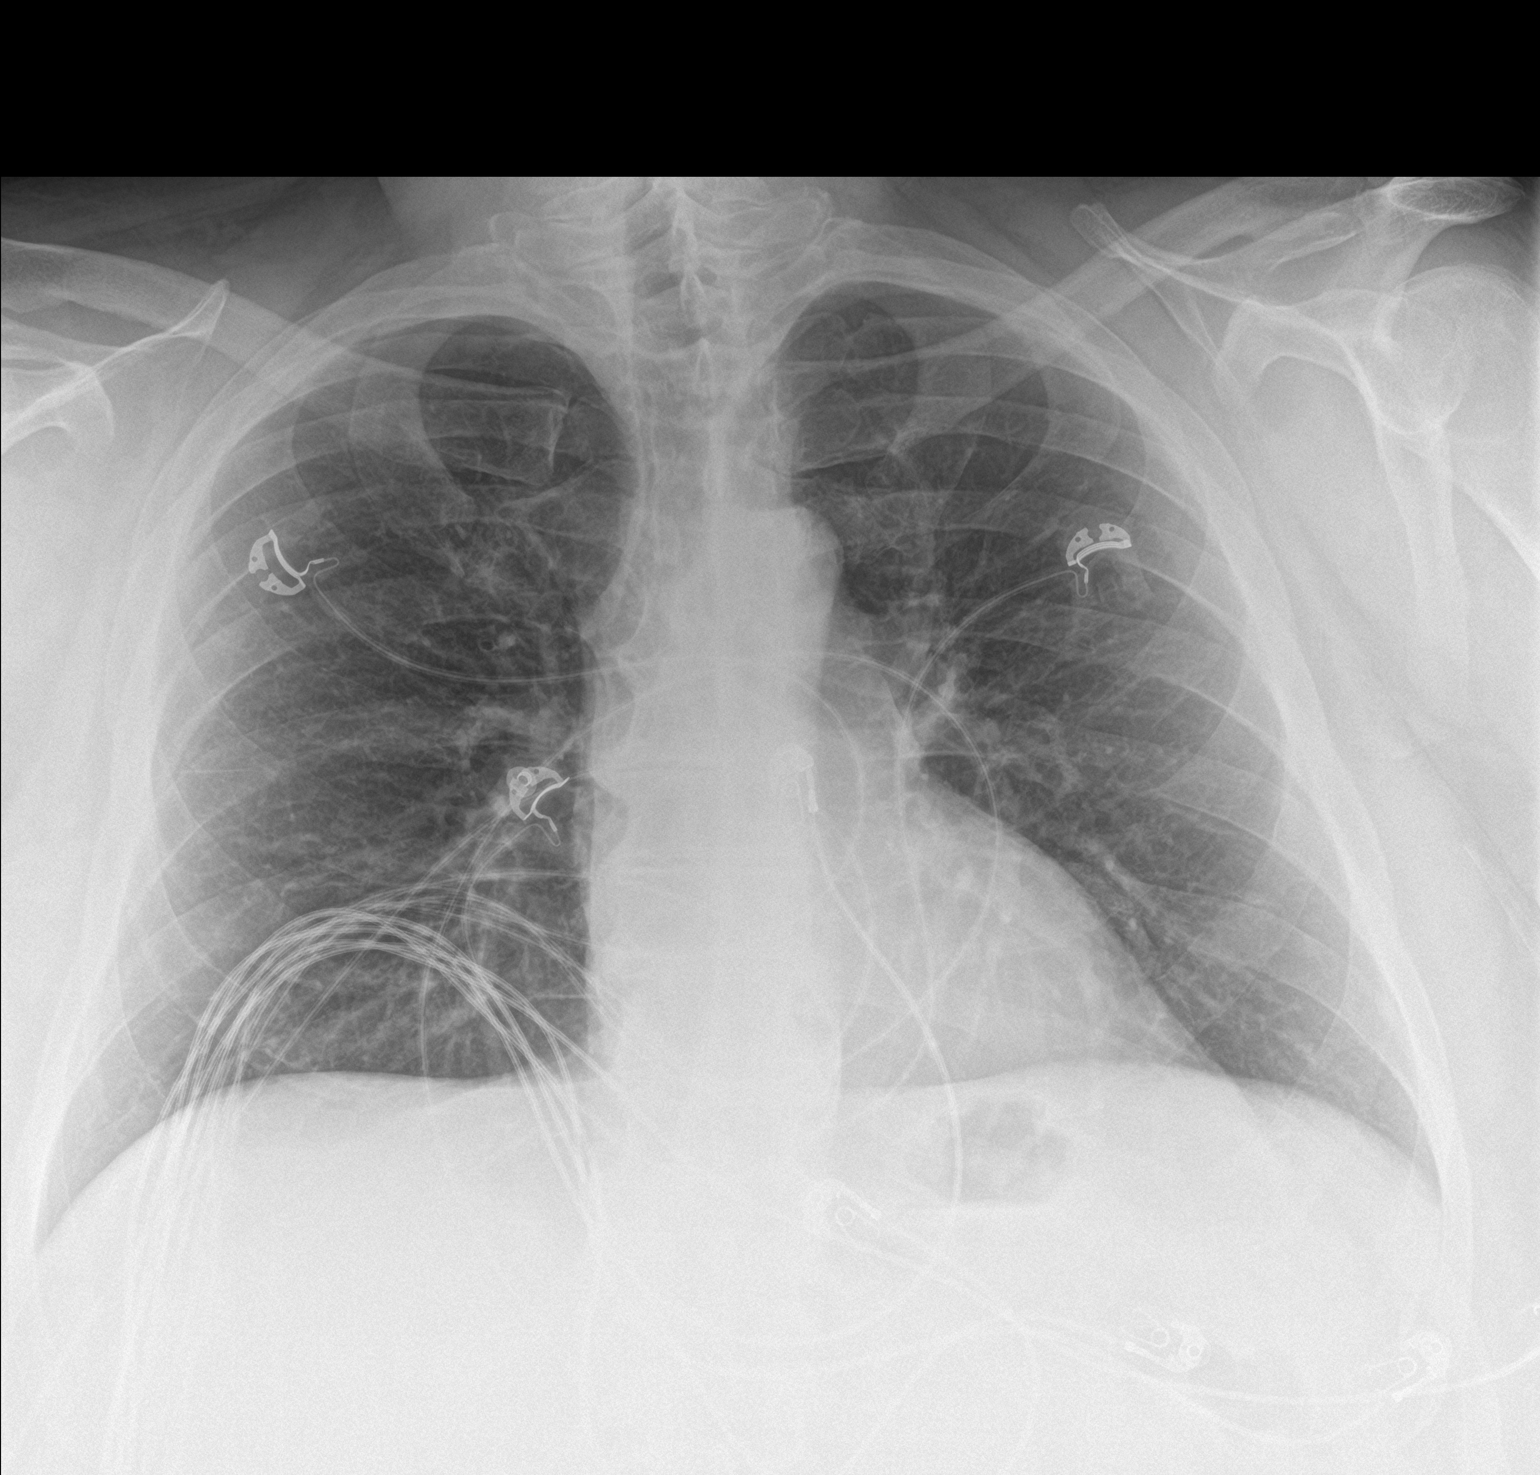

[2 of 2 positions shown; findings below may reference images not displayed]

FINDINGS: Normal heart size, mediastinal contours, and pulmonary vascularity.

Minimal bronchitic changes.

Lungs clear.

No pulmonary infiltrate, pleural effusion, or pneumothorax.

Osseous demineralization with endplate spur formation thoracic
spine.
IMPRESSION: Minimal bronchitic changes without acute infiltrate.

## 2019-06-18 ENCOUNTER — Other Ambulatory Visit: Payer: Self-pay

## 2019-06-18 DIAGNOSIS — Z20822 Contact with and (suspected) exposure to covid-19: Secondary | ICD-10-CM

## 2019-06-20 ENCOUNTER — Telehealth: Payer: Self-pay | Admitting: General Practice

## 2019-06-20 LAB — NOVEL CORONAVIRUS, NAA: SARS-CoV-2, NAA: NOT DETECTED

## 2019-06-20 NOTE — Telephone Encounter (Signed)
Patient informed of negative covid-19 result. Patient verbalized understanding.  °

## 2019-12-09 ENCOUNTER — Ambulatory Visit: Payer: BC Managed Care – PPO | Admitting: Cardiology

## 2019-12-11 ENCOUNTER — Emergency Department (HOSPITAL_COMMUNITY): Payer: No Typology Code available for payment source

## 2019-12-11 ENCOUNTER — Encounter (HOSPITAL_COMMUNITY): Payer: Self-pay | Admitting: Emergency Medicine

## 2019-12-11 ENCOUNTER — Emergency Department (HOSPITAL_COMMUNITY)
Admission: EM | Admit: 2019-12-11 | Discharge: 2019-12-11 | Disposition: A | Discharge status: Home / Self Care | Payer: No Typology Code available for payment source | Attending: Emergency Medicine | Admitting: Emergency Medicine

## 2019-12-11 ENCOUNTER — Other Ambulatory Visit: Payer: Self-pay

## 2019-12-11 DIAGNOSIS — S42215A Unspecified nondisplaced fracture of surgical neck of left humerus, initial encounter for closed fracture: Secondary | ICD-10-CM | POA: Diagnosis not present

## 2019-12-11 DIAGNOSIS — Y990 Civilian activity done for income or pay: Secondary | ICD-10-CM | POA: Diagnosis not present

## 2019-12-11 DIAGNOSIS — Y9389 Activity, other specified: Secondary | ICD-10-CM | POA: Insufficient documentation

## 2019-12-11 DIAGNOSIS — Z7901 Long term (current) use of anticoagulants: Secondary | ICD-10-CM | POA: Diagnosis not present

## 2019-12-11 DIAGNOSIS — S4992XA Unspecified injury of left shoulder and upper arm, initial encounter: Secondary | ICD-10-CM | POA: Diagnosis present

## 2019-12-11 DIAGNOSIS — X500XXA Overexertion from strenuous movement or load, initial encounter: Secondary | ICD-10-CM | POA: Diagnosis not present

## 2019-12-11 DIAGNOSIS — Y92812 Truck as the place of occurrence of the external cause: Secondary | ICD-10-CM | POA: Insufficient documentation

## 2019-12-11 DIAGNOSIS — I4891 Unspecified atrial fibrillation: Secondary | ICD-10-CM | POA: Insufficient documentation

## 2019-12-11 MED ORDER — OXYCODONE-ACETAMINOPHEN 5-325 MG PO TABS: 1.0000 | ORAL_TABLET | Freq: Three times a day (TID) | ORAL | 0 refills | Status: DC | PRN

## 2019-12-11 MED ORDER — HYDROMORPHONE HCL 1 MG/ML IJ SOLN
1.0000 mg | Freq: Once | INTRAMUSCULAR | Status: AC
  Administered 2019-12-11: 1 mg via INTRAVENOUS
  Filled 2019-12-11: qty 1

## 2019-12-11 NOTE — ED Triage Notes (Addendum)
Patient arrives via [facility] from work where he was lifting himself up and heard a pop in his L shoulder. Obvious deformity noted at shoulder joint. Pt given fentanyl pta. Does endorse some shoulder pain prior to injury today. PMS intact.

## 2019-12-11 NOTE — ED Provider Notes (Signed)
[facility] EMERGENCY DEPARTMENT Provider Note   CSN: [id number] Arrival date & time: 12/11/19  1103     History Chief Complaint  Patient presents with  . Shoulder Injury    Joshua Mcgee is a 54 y.o. male with a past medical history of A. fib on Coumadin, hypertension, obesity presenting to the ED with a chief complaint of left shoulder pain.  States that 1 week ago he was lifting things and loading onto his truck when he felt a pain in his left shoulder.  This morning while at work he was crawling to get under a machine when he felt like his "shoulder gave out on me."  He reports progressive worsening pain since then.  Denies any prior fracture, dislocations or procedures in the area.  He denies any other injuries today.  HPI     Past Medical History:  Diagnosis Date  . Atrial fibrillation (HCC)    on Coumadin  . Chronic anticoagulation   . Depression   . Hypertension   . OSA on CPAP     Patient Active Problem List   Diagnosis Date Noted  . A-fib (HCC) 05/09/2018  . Pure hypercholesterolemia   . Morbid obesity (HCC)   . Asthmatic bronchitis 04/09/2018  . Other fatigue 03/05/2018  . Encounter for health maintenance examination 04/07/2017  . Impaired fasting blood sugar 04/07/2017  . Abnormal INR 08/01/2016  . Encounter for therapeutic drug monitoring 12/25/2015  . Long term (current) use of anticoagulants 12/25/2015  . Morbid obesity with BMI of 60.0-69.9, adult (HCC) 12/21/2015  . History of DVT (deep vein thrombosis) 11/11/2015  . High risk medication use 10/15/2015  . Unspecified atrial fibrillation (HCC) 03/03/2015  . Acute embolism and thrombosis of unspecified deep veins of unspecified lower extremity (HCC) 03/03/2015  . Anxiety 03/03/2015  . Chest pain 03/03/2015  . Gastro-esophageal reflux disease without esophagitis 03/03/2015  . Hyperlipidemia 03/03/2015  . Hypertension 03/03/2015  . Major depressive disorder with current active episode  03/03/2015  . Obstructive sleep apnea (adult) (pediatric) 03/03/2015  . Morbid obesity due to excess calories (HCC) 03/03/2015    History reviewed. No pertinent surgical history.     Family History  Problem Relation Age of Onset  . Heart disease Mother   . Diabetes Mother     Social History   Tobacco Use  . Smoking status: Former Games developer  . Smokeless tobacco: Current User  Substance Use Topics  . Alcohol use: Not Currently  . Drug use: Never    Home Medications Prior to Admission medications   Medication Sig Start Date End Date Taking? Authorizing Provider  albuterol (PROVENTIL HFA;VENTOLIN HFA) 108 (90 Base) MCG/ACT inhaler Inhale 2 puffs into the lungs every 4 (four) hours as needed. 04/09/18   [provider]  atenolol (TENORMIN) 50 MG tablet Take 50 mg by mouth 2 (two) times daily. 09/18/17   [provider]  atorvastatin (LIPITOR) 10 MG tablet Take 10 mg by mouth daily. 10/05/18   [provider]  flecainide (TAMBOCOR) 100 MG tablet Take 3 tablets (300 mg total) by mouth Once PRN (Take only if you have atrial fibrillation). Do not repeat dose within 4 days. 05/14/18   Newman Nip, NP  gabapentin (NEURONTIN) 300 MG capsule Take 300 mg by mouth 2 (two) times daily. 07/04/18   [provider]  omeprazole (PRILOSEC) 20 MG capsule Take 20 mg by mouth daily.    [provider]  oxyCODONE-acetaminophen (PERCOCET/ROXICET) 5-325 MG tablet Take  1 tablet by mouth every 8 (eight) hours as needed for severe pain. 12/11/19   Rilyn Scroggs, PA-C  PARoxetine (PAXIL) 40 MG tablet Take 60 mg by mouth daily. 07/04/18   [provider]  valsartan (DIOVAN) 160 MG tablet Take 160 mg by mouth daily. 03/05/18   [provider]  warfarin (COUMADIN) 10 MG tablet Take 10 mg by mouth See admin instructions. Take 5 mg Mon. Tues. Thurs and, Sat   Take 10 mg  Wed. Sun. Fri  Take in the morning 04/24/18   [provider]    Allergies      Diphenhydramine hcl and Prednisone  Review of Systems   Review of Systems  Constitutional: Negative for appetite change, chills and fever.  HENT: Negative for ear pain, rhinorrhea, sneezing and sore throat.   Eyes: Negative for photophobia and visual disturbance.  Respiratory: Negative for cough, chest tightness, shortness of breath and wheezing.   Cardiovascular: Negative for chest pain and palpitations.  Gastrointestinal: Negative for abdominal pain, blood in stool, constipation, diarrhea, nausea and vomiting.  Genitourinary: Negative for dysuria, hematuria and urgency.  Musculoskeletal: Positive for arthralgias. Negative for myalgias.  Skin: Negative for rash.  Neurological: Negative for dizziness, weakness and light-headedness.    Physical Exam Updated Vital Signs BP (!) 143/80 (BP Location: Right Arm)   Pulse 71   Temp 98.4 F (36.9 C) (Oral)   Resp 17   SpO2 97%   Physical Exam Vitals and nursing note reviewed.  Constitutional:      General: He is not in acute distress.    Appearance: He is well-developed. He is obese.  HENT:     Head: Normocephalic and atraumatic.     Nose: Nose normal.  Eyes:     General: No scleral icterus.       Left eye: No discharge.     Conjunctiva/sclera: Conjunctivae normal.  Cardiovascular:     Rate and Rhythm: Normal rate and regular rhythm.     Heart sounds: Normal heart sounds. No murmur. No friction rub. No gallop.   Pulmonary:     Effort: Pulmonary effort is normal. No respiratory distress.     Breath sounds: Normal breath sounds.  Abdominal:     General: Bowel sounds are normal. There is no distension.     Palpations: Abdomen is soft.     Tenderness: There is no abdominal tenderness. There is no guarding.  Musculoskeletal:        General: Swelling and tenderness present. Normal range of motion.     Cervical back: Normal range of motion and neck supple.     Comments: Tenderness to palpation diffusely of the left shoulder with  edema noted.  No open wounds noted.  No skin tenting noted.  Full range of motion of left elbow and wrist, digits.  Limited range of motion of left shoulder secondary to pain.  Sensation intact to light touch of bilateral upper extremities.  Skin:    General: Skin is warm and dry.     Findings: No rash.  Neurological:     Mental Status: He is alert.     Motor: No abnormal muscle tone.     Coordination: Coordination normal.     ED Results / Procedures / Treatments   Labs (all labs ordered are listed, but only abnormal results are displayed) Labs Reviewed - No data to display  EKG None  Radiology DG Shoulder Left  Result Date: 12/11/2019 CLINICAL DATA:  Left arm pain after  fall EXAM: LEFT SHOULDER - 2+ VIEW COMPARISON:  None. FINDINGS: Acute, comminuted transversely oriented fracture of the proximal left humeral metaphysis and surgical neck. No significant displacement. Mild lateral angulation. Suspect nondisplaced involvement of the greater tuberosity. No evidence of intra-articular extension. No glenohumeral joint dislocation. AC joint intact with mild arthropathy. Soft tissues within normal limits. IMPRESSION: Acute, comminuted mildly angulated fracture of the proximal left humeral metaphysis/surgical neck with likely involvement of the greater tuberosity. Electronically Signed   By: Duanne Guess D.O.   On: 12/11/2019 11:48    Procedures Procedures (including critical care time)  Medications Ordered in ED Medications  HYDROmorphone (DILAUDID) injection 1 mg (1 mg Intravenous Given 12/11/19 1238)    ED Course  I have reviewed the triage vital signs and the nursing notes.  Pertinent labs & imaging results that were available during my care of the patient were reviewed by me and considered in my medical decision making (see chart for details).    MDM Rules/Calculators/A&P                      54 year old male presents to ED with chief complaint of nondominant left shoulder  pain.  1 week ago had an injury where he was lifting and loading onto his truck.  Today was crawling on his arms at work when he felt his left shoulder snap.  There is tenderness palpation, edema of the left shoulder with limited range of motion.  X-ray shows proximal left humeral metaphysis/surgical neck fracture.  Area is neurovascularly intact.  Compartments are soft.  No skin tenting noted.  Spoke to Earney Hamburg, orthopedic PA who recommends sling and follow-up in the clinic with Dr. Roda Shutters.  Although this appears to be an unusual mechanism for his fracture, feel that his injury 1 week ago may have loosened up his shoulder which made him more injury prone today while he put all of his weight onto his arms with crawling.  Will be discharged home with short course of pain medication, sling and orthopedic follow-up.  Patient is hemodynamically stable, in NAD, and able to ambulate in the ED. Evaluation does not show pathology that would require ongoing emergent intervention or inpatient treatment. I have personally reviewed and interpreted all lab work and imaging at today's ED visit. I explained the diagnosis to the patient. Pain has been managed and has no complaints prior to discharge. Patient is comfortable with above plan and is stable for discharge at this time. All questions were answered prior to disposition. Strict return precautions for returning to the ED were discussed. Encouraged follow up with PCP.   An After Visit Summary was printed and given to the patient.  Prior to providing a prescription for a controlled substance, I independently reviewed the patient's recent prescription history on the [place] Controlled Substance Reporting System. The patient had no recent or regular prescriptions and was deemed appropriate for a brief prescription of narcotic for acute analgesia.   Portions of this note were generated with Scientist, clinical (histocompatibility and immunogenetics). Dictation errors may occur despite best  attempts at proofreading.  Final Clinical Impression(s) / ED Diagnoses Final diagnoses:  Closed nondisplaced fracture of surgical neck of left humerus, unspecified fracture morphology, initial encounter    Rx / DC Orders ED Discharge Orders         Ordered    oxyCODONE-acetaminophen (PERCOCET/ROXICET) 5-325 MG tablet  Every 8 hours PRN     12/11/19 1312  Dietrich Pates, PA-C 12/11/19 1312    Cathren Laine, MD 12/14/19 1456

## 2019-12-11 NOTE — Progress Notes (Signed)
Orthopedic Tech Progress Note Patient Details:  Joshua Mcgee [birth year] [id number] I had to take the straps off of another SHOULDER SLING to make it fit patient correctly  Ortho Devices Type of Ortho Device: Shoulder immobilizer Ortho Device/Splint Location: LUE Ortho Device/Splint Interventions: Ordered, Application, Adjustment   Post Interventions Patient Tolerated: Well Instructions Provided: Care of device, Adjustment of device   Donald Pore 12/11/2019, 1:17 PM

## 2019-12-11 NOTE — Discharge Instructions (Addendum)
Follow-up with orthopedic provider listed below. Return to the ED if you start to develop worsening pain, additional injuries, swelling, lightheadedness.

## 2019-12-12 ENCOUNTER — Encounter: Payer: Self-pay | Admitting: Orthopaedic Surgery

## 2019-12-12 ENCOUNTER — Ambulatory Visit (INDEPENDENT_AMBULATORY_CARE_PROVIDER_SITE_OTHER): Payer: No Typology Code available for payment source | Admitting: Orthopaedic Surgery

## 2019-12-12 VITALS — Ht 70.0 in | Wt >= 6400 oz

## 2019-12-12 DIAGNOSIS — Z6841 Body Mass Index (BMI) 40.0 and over, adult: Secondary | ICD-10-CM | POA: Diagnosis not present

## 2019-12-12 DIAGNOSIS — S42295A Other nondisplaced fracture of upper end of left humerus, initial encounter for closed fracture: Secondary | ICD-10-CM | POA: Diagnosis not present

## 2019-12-12 DIAGNOSIS — S42202A Unspecified fracture of upper end of left humerus, initial encounter for closed fracture: Secondary | ICD-10-CM | POA: Insufficient documentation

## 2019-12-12 NOTE — Progress Notes (Signed)
Office Visit Note   Patient: Joshua Mcgee           Date of Birth: 1965-11-15           MRN: [id number] Visit Date: 12/12/2019              Requested by: Karlene Einstein, MD [address] [place],  [place] [address] PCP: Karlene Einstein, MD   Assessment & Plan: Visit Diagnoses:  1. Other closed nondisplaced fracture of proximal end of left humerus, initial encounter   2. Body mass index 60.0-69.9, adult ([place])   3. Morbid obesity ([place])     Plan: Impression is left proximal humeral shaft fracture.  I reviewed the x-rays in detail and given the mechanism we will need to obtain a CT scan to rule out malignancy or pathologic fracture.  He will remain in the sling at all times.  Out of work for up to 6 weeks.  We will see him back next Tuesday to review the CT scan.    Follow-Up Instructions: Return in about 5 days (around 12/17/2019).   Orders:  No orders of the defined types were placed in this encounter.  No orders of the defined types were placed in this encounter.     Procedures: No procedures performed   Clinical Data: No additional findings.   Subjective: Chief Complaint  Patient presents with  . Left Arm - Injury    DOI 12/11/2019    Joshua Mcgee is a 54 year old gentleman who comes in for evaluation of a left proximal humeral shaft fracture that he suffered yesterday when he was climbing in a cooling tower.  He states that he was on his hands and knees when he felt a snap.  He denies a history of cancer.  He does have a history of DVT atrial fibrillation.  He is on chronic warfarin.   Review of Systems  Constitutional: Negative.   All other systems reviewed and are negative.    Objective: Vital Signs: Ht 5\' 10"  (1.778 m)   Wt (!) 450 lb (204.1 kg)   BMI 64.57 kg/m   Physical Exam Vitals and nursing note reviewed.  Constitutional:      Appearance: He is well-developed.  HENT:     Head: Normocephalic and atraumatic.  Eyes:     Pupils: Pupils are equal,  round, and reactive to light.  Pulmonary:     Effort: Pulmonary effort is normal.  Abdominal:     Palpations: Abdomen is soft.  Musculoskeletal:        General: Normal range of motion.     Cervical back: Neck supple.  Skin:    General: Skin is warm.  Neurological:     Mental Status: He is alert and oriented to person, place, and time.  Psychiatric:        Behavior: Behavior normal.        Thought Content: Thought content normal.        Judgment: Judgment normal.     Ortho Exam Left upper extremity exam shows mild swelling.  No neurovascular compromise. Specialty Comments:  No specialty comments available.  Imaging: DG Shoulder Left  Result Date: 12/11/2019 CLINICAL DATA:  Left arm pain after fall EXAM: LEFT SHOULDER - 2+ VIEW COMPARISON:  None. FINDINGS: Acute, comminuted transversely oriented fracture of the proximal left humeral metaphysis and surgical neck. No significant displacement. Mild lateral angulation. Suspect nondisplaced involvement of the greater tuberosity. No evidence of intra-articular extension. No glenohumeral joint dislocation. AC  joint intact with mild arthropathy. Soft tissues within normal limits. IMPRESSION: Acute, comminuted mildly angulated fracture of the proximal left humeral metaphysis/surgical neck with likely involvement of the greater tuberosity. Electronically Signed   By: Duanne Guess D.O.   On: 12/11/2019 11:48     PMFS History: Patient Active Problem List   Diagnosis Date Noted  . Body mass index 60.0-69.9, adult (HCC) 12/12/2019  . Closed fracture of left proximal humerus 12/12/2019  . A-fib (HCC) 05/09/2018  . Pure hypercholesterolemia   . Morbid obesity (HCC)   . Asthmatic bronchitis 04/09/2018  . Other fatigue 03/05/2018  . Encounter for health maintenance examination 04/07/2017  . Impaired fasting blood sugar 04/07/2017  . Abnormal INR 08/01/2016  . Encounter for therapeutic drug monitoring 12/25/2015  . Long term (current) use  of anticoagulants 12/25/2015  . Morbid obesity with BMI of 60.0-69.9, adult (HCC) 12/21/2015  . History of DVT (deep vein thrombosis) 11/11/2015  . High risk medication use 10/15/2015  . Unspecified atrial fibrillation (HCC) 03/03/2015  . Acute embolism and thrombosis of unspecified deep veins of unspecified lower extremity (HCC) 03/03/2015  . Anxiety 03/03/2015  . Chest pain 03/03/2015  . Gastro-esophageal reflux disease without esophagitis 03/03/2015  . Hyperlipidemia 03/03/2015  . Hypertension 03/03/2015  . Major depressive disorder with current active episode 03/03/2015  . Obstructive sleep apnea (adult) (pediatric) 03/03/2015  . Morbid obesity due to excess calories (HCC) 03/03/2015   Past Medical History:  Diagnosis Date  . Atrial fibrillation (HCC)    on Coumadin  . Chronic anticoagulation   . Depression   . Hypertension   . OSA on CPAP     Family History  Problem Relation Age of Onset  . Heart disease Mother   . Diabetes Mother     History reviewed. No pertinent surgical history. Social History   Occupational History  . Not on file  Tobacco Use  . Smoking status: Former Games developer  . Smokeless tobacco: Current User  Substance and Sexual Activity  . Alcohol use: Not Currently  . Drug use: Never  . Sexual activity: Not on file

## 2019-12-13 ENCOUNTER — Ambulatory Visit
Admission: RE | Admit: 2019-12-13 | Discharge: 2019-12-13 | Disposition: A | Discharge status: Home / Self Care | Payer: BC Managed Care – PPO | Source: Ambulatory Visit | Attending: Orthopaedic Surgery | Admitting: Orthopaedic Surgery

## 2019-12-13 DIAGNOSIS — S42295A Other nondisplaced fracture of upper end of left humerus, initial encounter for closed fracture: Secondary | ICD-10-CM

## 2019-12-14 NOTE — Progress Notes (Signed)
Needs f/u appt Tuesday please.  Thanks.

## 2019-12-16 NOTE — Progress Notes (Signed)
Needs f/u appt with me please.  Thanks.

## 2019-12-19 ENCOUNTER — Other Ambulatory Visit: Payer: Self-pay | Admitting: Orthopedic Surgery

## 2019-12-19 ENCOUNTER — Other Ambulatory Visit (HOSPITAL_COMMUNITY): Payer: Self-pay | Admitting: Orthopedic Surgery

## 2019-12-19 DIAGNOSIS — S42202A Unspecified fracture of upper end of left humerus, initial encounter for closed fracture: Secondary | ICD-10-CM

## 2019-12-20 ENCOUNTER — Other Ambulatory Visit (HOSPITAL_COMMUNITY): Payer: BC Managed Care – PPO

## 2020-01-06 ENCOUNTER — Ambulatory Visit: Payer: BC Managed Care – PPO | Admitting: Cardiology

## 2020-02-03 ENCOUNTER — Ambulatory Visit: Payer: BC Managed Care – PPO | Admitting: Cardiology

## 2020-08-23 IMAGING — DX DG SHOULDER 2+V*L*
3 series · 3 of 3 positions shown · non-contrast
Comparison: None.

CLINICAL DATA: Left arm pain after fall

EXAM:
LEFT SHOULDER - 2+ VIEW

[w shoulder internal left]
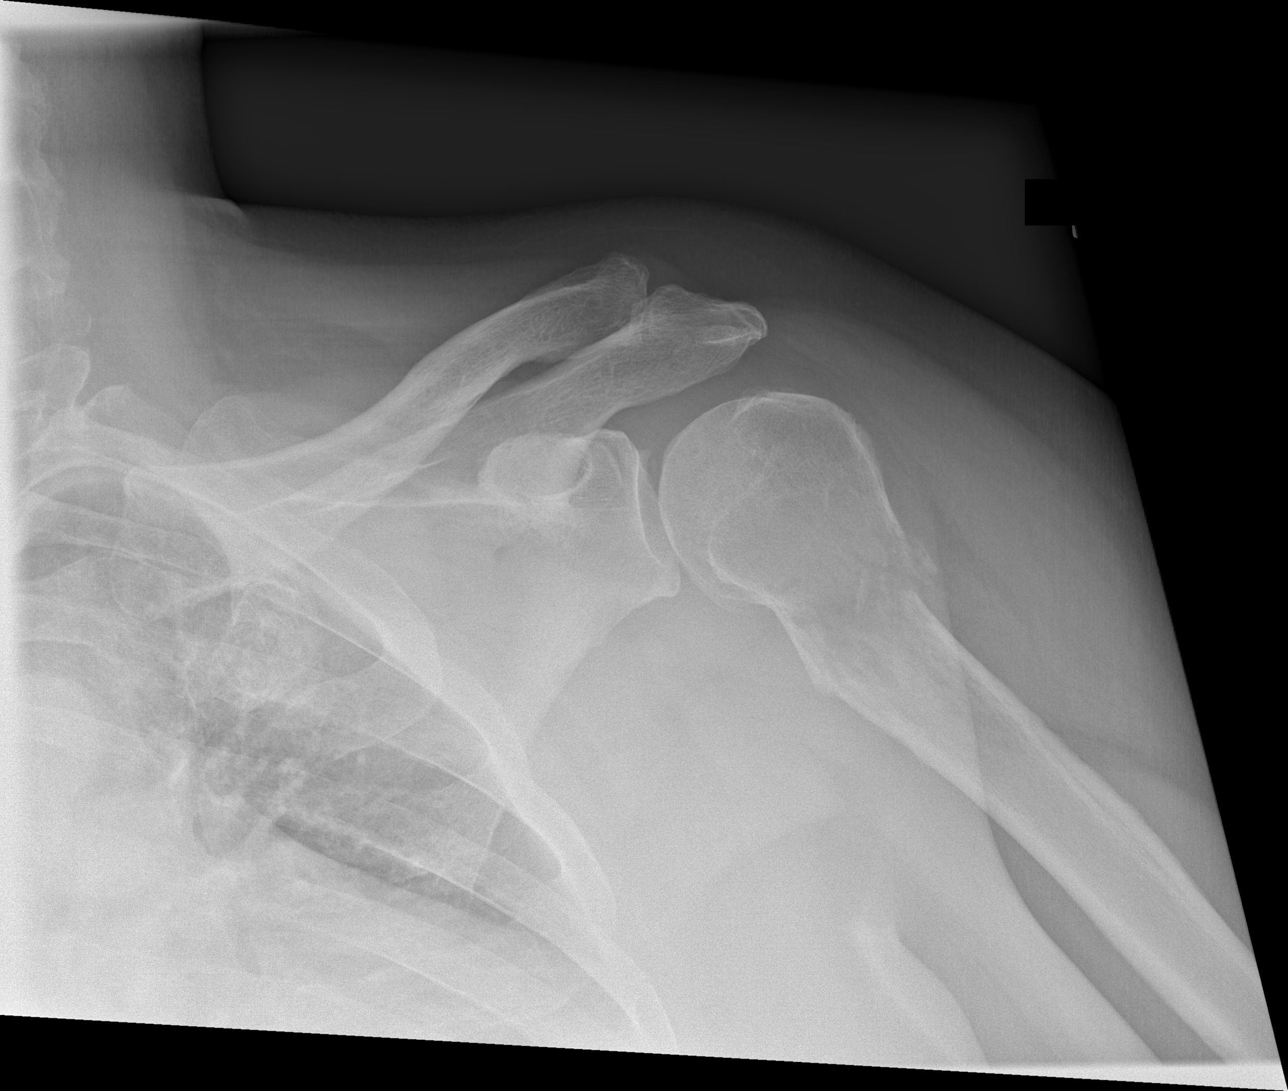

[w shoulder y-view left (1 of 2)]
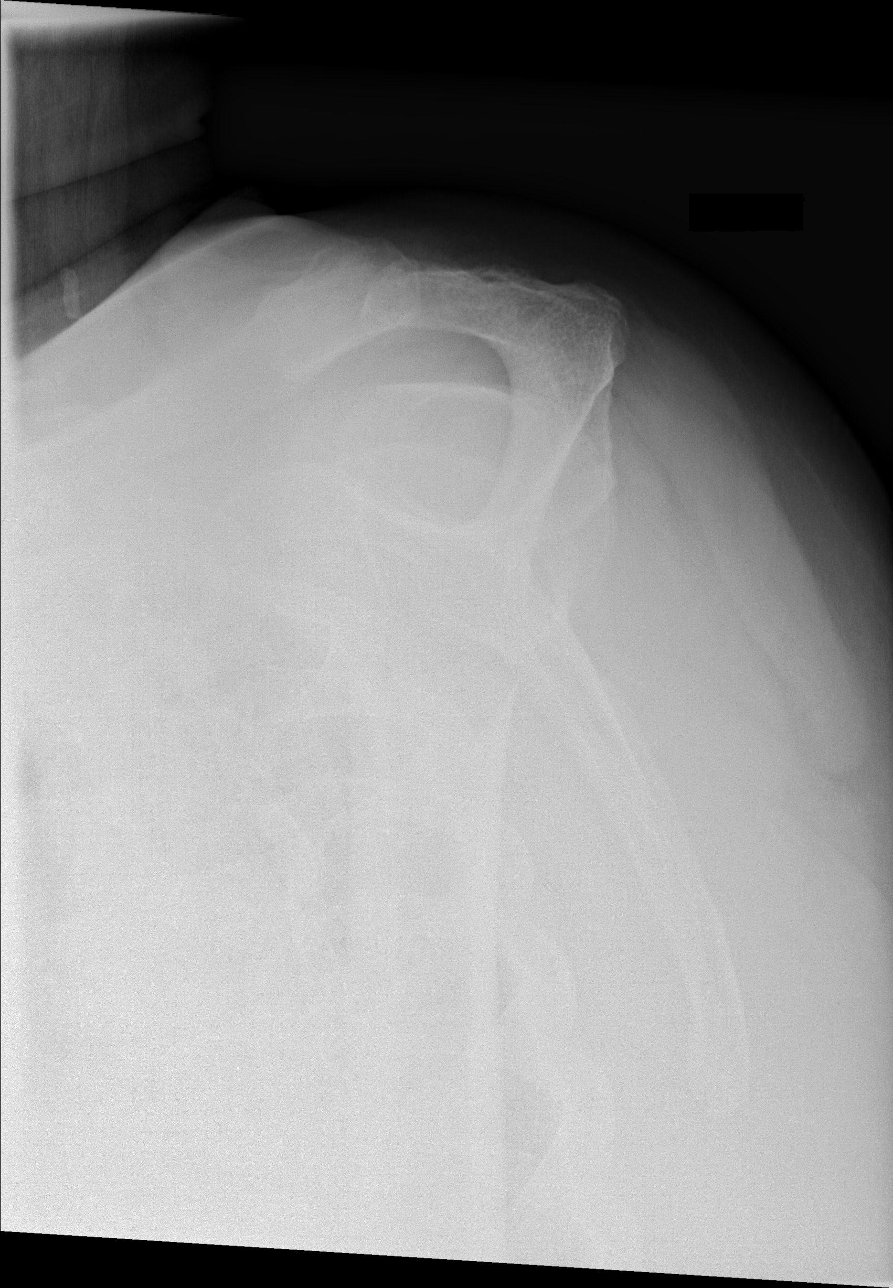

[w shoulder y-view left (2 of 2)]
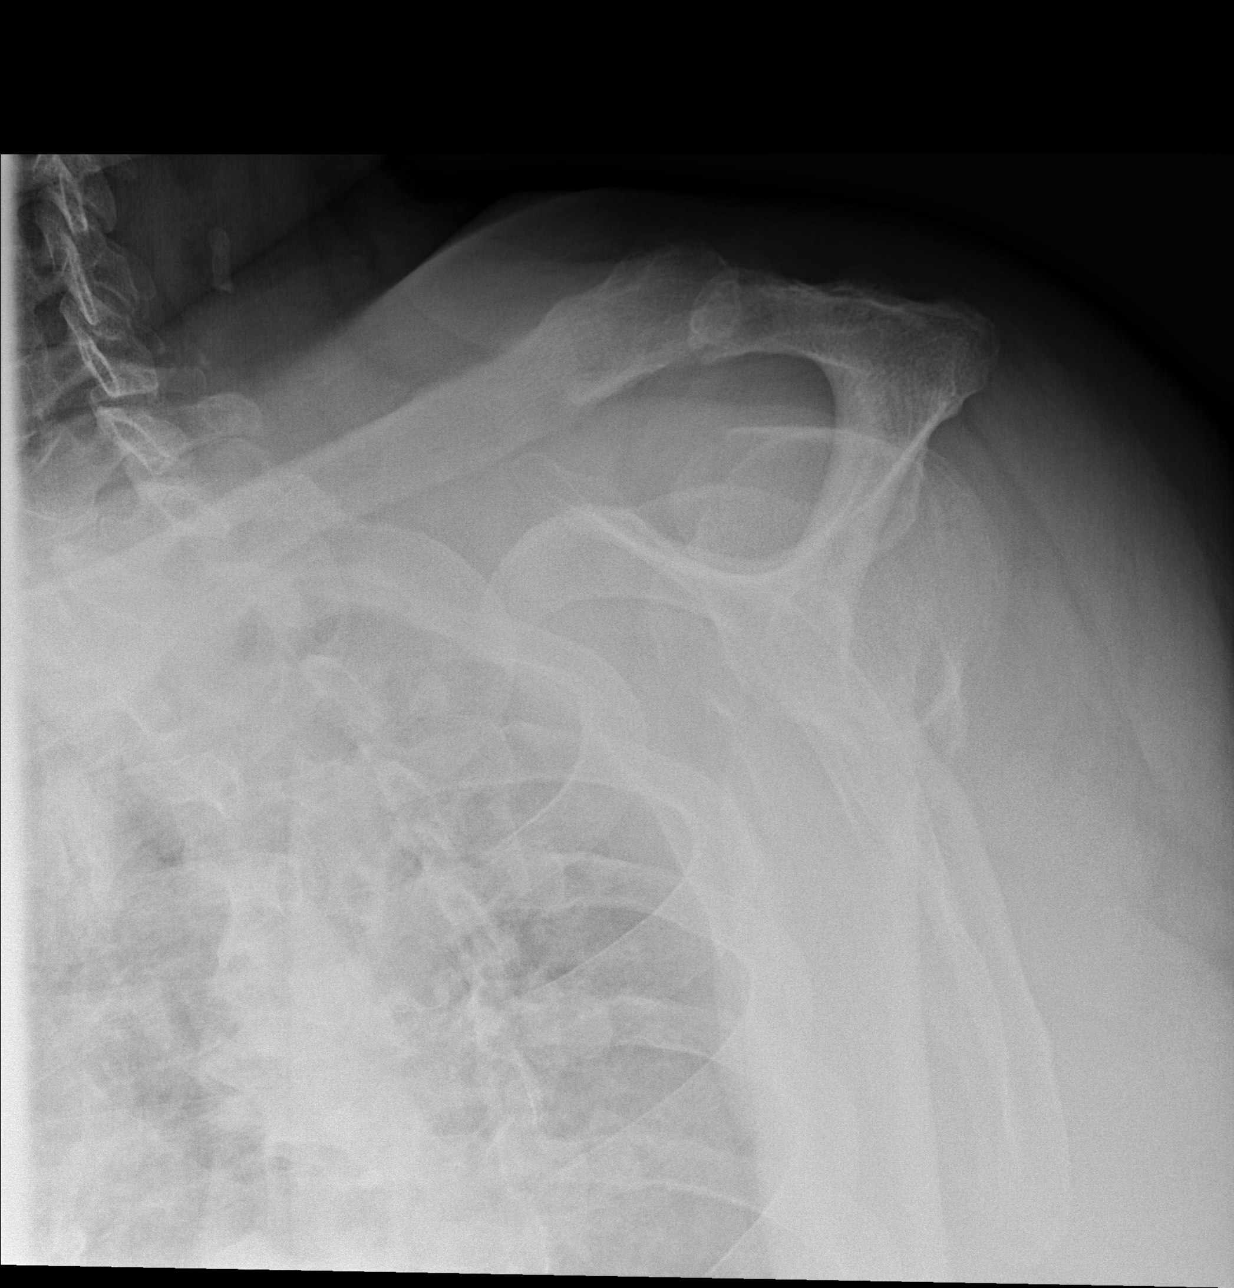

[3 of 3 positions shown; findings below may reference images not displayed]

FINDINGS: Acute, comminuted transversely oriented fracture of the proximal
left humeral metaphysis and surgical neck. No significant
displacement. Mild lateral angulation. Suspect nondisplaced
involvement of the greater tuberosity. No evidence of
intra-articular extension. No glenohumeral joint dislocation. AC
joint intact with mild arthropathy. Soft tissues within normal
limits.
IMPRESSION: Acute, comminuted mildly angulated fracture of the proximal left
humeral metaphysis/surgical neck with likely involvement of the
greater tuberosity.

## 2020-08-24 ENCOUNTER — Other Ambulatory Visit: Payer: Self-pay

## 2020-08-24 ENCOUNTER — Ambulatory Visit (INDEPENDENT_AMBULATORY_CARE_PROVIDER_SITE_OTHER): Payer: BC Managed Care – PPO | Admitting: Cardiology

## 2020-08-24 ENCOUNTER — Encounter: Payer: Self-pay | Admitting: Cardiology

## 2020-08-24 ENCOUNTER — Telehealth: Payer: Self-pay | Admitting: Cardiology

## 2020-08-24 VITALS — BP 144/91 | HR 77 | Ht 70.0 in | Wt >= 6400 oz

## 2020-08-24 DIAGNOSIS — I480 Paroxysmal atrial fibrillation: Secondary | ICD-10-CM | POA: Diagnosis not present

## 2020-08-24 MED ORDER — CARVEDILOL 25 MG PO TABS: 25.0000 mg | ORAL_TABLET | Freq: Two times a day (BID) | ORAL | 3 refills | Status: DC

## 2020-08-24 NOTE — Patient Instructions (Signed)
Medication Instructions:  Your physician has recommended you make the following change in your medication:  1. STOP Atenolol 2. START Carvedilol 25 mg twice a day  *If you need a refill on your cardiac medications before your next appointment, please call your pharmacy*   Lab Work: None ordered   Testing/Procedures: None ordered   Follow-Up: At [facility], you and your health needs are our priority.  As part of our continuing mission to provide you with exceptional heart care, we have created designated Provider Care Teams.  These Care Teams include your primary Cardiologist (physician) and Advanced Practice Providers (APPs -  Physician Assistants and Nurse Practitioners) who all work together to provide you with the care you need, when you need it.  We recommend signing up for the patient portal called "MyChart".  Sign up information is provided on this After Visit Summary.  MyChart is used to connect with patients for Virtual Visits (Telemedicine).  Patients are able to view lab/test results, encounter notes, upcoming appointments, etc.  Non-urgent messages can be sent to your provider as well.   To learn more about what you can do with MyChart, go to [email or web address].    Your next appointment:   3 month(s)  The format for your next appointment:   In Person  Provider:   Loman Brooklyn, MD    Thank you for choosing [facility]!!   Dory Horn, RN [phone]   Other Instructions

## 2020-08-24 NOTE — Telephone Encounter (Signed)
Pt scheduled to see Dr. Elberta Fortis in the [facility] office this afternoon. Pt is agreeable to plan.

## 2020-08-24 NOTE — Telephone Encounter (Signed)
Pt called to report that he ate a "gummy" to help him sleep over the weekend. He then developed palpitations and felt he was in AFIB for about 24 hours before converting back to a "normal" rhythm... he says he checked his BP and it was 185/111 Saturday morning and 159/94 when he got up that afternoon but he has not checked it since.  Today he says that he is feeling well.   Pt is till taking his Coumadin.   He has cancelled multiple appts with Dr. Elberta Fortis over the past several months... his last OV was 10/2018.   He is asking to be seen asap but no appts available with Dr. Harold Hedge in [facility] for quite a while..  Will forward to [facility] for review and advice on making him an appt.

## 2020-08-24 NOTE — Progress Notes (Signed)
Electrophysiology Office Note   Date:  08/24/2020   ID:  Joshua Mcgee, DOB 01/22/1966, MRN [id number]  PCP:  [name], MD  Cardiologist:  [name] Primary Electrophysiologist:  [name], MD    No chief complaint on file.    History of Present Illness: Joshua Mcgee is a 54 y.o. male who is being seen today for the evaluation of atrial fibrillation at the request of [name]. Presenting today for electrophysiology evaluation.  He has a history of hypertension, morbid obesity, OSA on CPAP, and atrial fibrillation on Coumadin.  He was seen in the emergency room 05/10/2018 with 2 unsuccessful attempts of cardioversion.  He was given 300 mg of flecainide and subsequently converted to normal rhythm.   Today, denies symptoms of palpitations, chest pain, shortness of breath, orthopnea, PND, lower extremity edema, claudication, dizziness, presyncope, syncope, bleeding, or neurologic sequela. The patient is tolerating medications without difficulties.  Since being seen he has done well.  He has had minimal episodes of atrial fibrillation.  He did go into atrial fibrillation this past weekend.  He had palpitations and some shortness of breath.  He subsequently converted to sinus rhythm.  Atrial fibrillation was confirmed by paramedics at the fire station.  He is also been checking his blood pressures which are quite high in the mornings.  Once he takes his medications, they come down to the 140s to 150 systolic.   Past Medical History:  Diagnosis Date  . Atrial fibrillation (HCC)    on Coumadin  . Chronic anticoagulation   . Depression   . Hypertension   . OSA on CPAP    History reviewed. No pertinent surgical history.   Current Outpatient Medications  Medication Sig Dispense Refill  . albuterol (PROVENTIL HFA;VENTOLIN HFA) 108 (90 Base) MCG/ACT inhaler Inhale 2 puffs into the lungs every 4 (four) hours as needed.    . flecainide (TAMBOCOR) 100 MG tablet Take 3 tablets  (300 mg total) by mouth Once PRN (Take only if you have atrial fibrillation). Do not repeat dose within 4 days. 12 tablet 0  . gabapentin (NEURONTIN) 300 MG capsule Take 300 mg by mouth 2 (two) times daily.    Marland Kitchen omeprazole (PRILOSEC) 20 MG capsule Take 20 mg by mouth daily.    Marland Kitchen PARoxetine (PAXIL) 40 MG tablet Take 60 mg by mouth daily.    . valsartan (DIOVAN) 160 MG tablet Take 160 mg by mouth daily.    Marland Kitchen warfarin (COUMADIN) 10 MG tablet Take 10 mg by mouth See admin instructions. Take 5 mg Mon. Tues. Thurs and, Sat   Take 10 mg  Wed. Sun. Fri  Take in the morning     No current facility-administered medications for this visit.    Allergies:   Diphenhydramine hcl, Atorvastatin, and Prednisone   Social History:  The patient  reports that he has quit smoking. He uses smokeless tobacco. He reports previous alcohol use. He reports that he does not use drugs.   Family History:  The patient's family history includes Diabetes in his mother; Heart disease in his mother.   ROS:  Please see the history of present illness.   Otherwise, review of systems is positive for none.   All other systems are reviewed and negative.   PHYSICAL EXAM: VS:  BP (!) 144/91   Pulse 77   Ht 5\' 10"  (1.778 m)   Wt (!) 448 lb 1.3 oz (203.2 kg)   SpO2 97%   BMI 64.29  kg/m  , BMI Body mass index is 64.29 kg/m. GEN: Well nourished, well developed, in no acute distress  HEENT: normal  Neck: no JVD, carotid bruits, or masses Cardiac: RRR; no murmurs, rubs, or gallops,no edema  Respiratory:  clear to auscultation bilaterally, normal work of breathing GI: soft, nontender, nondistended, + BS MS: no deformity or atrophy  Skin: warm and dry Neuro:  Strength and sensation are intact Psych: euthymic mood, full affect  EKG:  EKG is ordered today. Personal review of the ekg ordered shows sinus rhythm, rate 77  Recent Labs: No results found for requested labs within last 8760 hours.    Lipid Panel  No results  found for: CHOL, TRIG, HDL, CHOLHDL, VLDL, LDLCALC, LDLDIRECT   Wt Readings from Last 3 Encounters:  08/24/20 (!) 448 lb 1.3 oz (203.2 kg)  12/12/19 (!) 450 lb (204.1 kg)  10/08/18 (!) 422 lb (191.4 kg)      Other studies Reviewed: Additional studies/ records that were reviewed today include: TTE 05/09/18  Review of the above records today demonstrates:  - Left ventricle: The cavity size was normal. Wall thickness was   increased in a pattern of mild LVH. Systolic function was normal.   The estimated ejection fraction was in the range of 55% to 60%.   Although no diagnostic regional wall motion abnormality was   identified, this possibility cannot be completely excluded on the   basis of this study. The study was not technically sufficient to   allow evaluation of LV diastolic dysfunction due to atrial   fibrillation. - Aortic valve: There was no stenosis. - Mitral valve: Mildly calcified annulus. There was no significant   regurgitation. - Right ventricle: Poorly visualized. The cavity size was normal.   Systolic function was normal. - Pulmonary arteries: No complete TR doppler jet so unable to   estimate PA systolic pressure. - Systemic veins: IVC poorly visualized.   ASSESSMENT AND PLAN:  1.  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  CHA2DS2-VASc of 1.  Has flecainide (monitoring for high risk medication) for a pill in the pocket approach.  He did go into atrial fibrillation over the weekend, confirmed by paramedics at the fire station.  He is converted back to normal rhythm and currently feels well.  2.  Obstructive sleep apnea: CPAP compliance encouraged  3.  Morbid obesity: Diet and exercise encouraged  4.  Hypertension: Blood pressure significantly elevated today and is elevated at home.  We Arvine Clayburn stop his atenolol and start him on 25 mg of Coreg.  He Maisee Vollman check his blood pressures at home and call us back if they remain elevated.  Current medicines  are reviewed at length with the patient today.   The patient does not have concerns regarding his medicines.  The following changes were made today: Stop atenolol, start Coreg  Labs/ tests ordered today include:  Orders Placed This Encounter  Procedures  . EKG 12-Lead     Disposition:   FU with Jedaiah Rathbun 3 months  Signed, Micaila Ziemba Jorja Loa, MD  08/24/2020 4:26 PM     [facility] [address] [place] [address] [phone] (office) [phone] (fax)

## 2020-08-24 NOTE — Telephone Encounter (Signed)
Pt c/o BP issue: STAT if pt c/o blurred vision, one-sided weakness or slurred speech  1. What are your last 5 BP readings?  159/94 once up and moving  185/111 in the mornings   2. Are you having any other symptoms (ex. Dizziness, headache, blurred vision, passed out)? Fatigue, and  Afib   3. What is your BP issue? Joshua Mcgee is calling stating he has been having hypertension and his PCP currently has him taking medication for it. He states this weekend on Friday night he went into Afib after taking a sleeping pill and didn't come out of it until Saturday afternoon. [name] is requesting an appt ASAP due to this. Please advise.

## 2020-11-09 ENCOUNTER — Ambulatory Visit: Payer: BC Managed Care – PPO | Admitting: Cardiology

## 2020-11-09 ENCOUNTER — Encounter: Payer: Self-pay | Admitting: Cardiology

## 2020-11-09 ENCOUNTER — Other Ambulatory Visit: Payer: Self-pay

## 2020-11-09 VITALS — BP 132/94 | HR 87 | Ht 70.0 in | Wt >= 6400 oz

## 2020-11-09 DIAGNOSIS — I480 Paroxysmal atrial fibrillation: Secondary | ICD-10-CM | POA: Diagnosis not present

## 2020-11-09 NOTE — Progress Notes (Signed)
Electrophysiology Office Note   Date:  11/09/2020   ID:  Joshua Mcgee, DOB 1965/10/23, MRN [id number]  PCP:  [name], MD  Cardiologist:  [name] Primary Electrophysiologist:  [name], MD    No chief complaint on file.    History of Present Illness: Joshua Mcgee is a 55 y.o. male who is being seen today for the evaluation of atrial fibrillation at the request of [name]. Presenting today for electrophysiology evaluation.    He has a history of hypertension, morbid obesity, OSA on CPAP, and atrial fibrillation.  He also has a DVTs and is on Coumadin.  He was seen in the emergency room 05/10/2018 with 2 unsuccessful attempts at cardioversion.  He was given 300 mg of flecainide and subsequently converted to normal rhythm.  Today, denies symptoms of palpitations, chest pain, shortness of breath, orthopnea, PND, lower extremity edema, claudication, dizziness, presyncope, syncope, bleeding, or neurologic sequela. The patient is tolerating medications without difficulties.  Since being seen he has done well.  He has no chest pain or shortness of breath.  He is taking all of his daily activities restriction.  He had one episode of atrial fibrillation.  He went to the fire station who confirmed this.  He was on his way home to take his flecainide, but converted to sinus rhythm.  He has had no further episodes.   Past Medical History:  Diagnosis Date  . Atrial fibrillation (HCC)    on Coumadin  . Chronic anticoagulation   . Depression   . Hypertension   . OSA on CPAP    History reviewed. No pertinent surgical history.   Current Outpatient Medications  Medication Sig Dispense Refill  . albuterol (PROVENTIL HFA;VENTOLIN HFA) 108 (90 Base) MCG/ACT inhaler Inhale 2 puffs into the lungs every 4 (four) hours as needed.    . carvedilol (COREG) 25 MG tablet Take 1 tablet (25 mg total) by mouth 2 (two) times daily. 60 tablet 3  . flecainide (TAMBOCOR) 100 MG tablet Take 3  tablets (300 mg total) by mouth Once PRN (Take only if you have atrial fibrillation). Do not repeat dose within 4 days. 12 tablet 0  . gabapentin (NEURONTIN) 300 MG capsule Take 300 mg by mouth 2 (two) times daily.    Marland Kitchen omeprazole (PRILOSEC) 20 MG capsule Take 20 mg by mouth daily.    Marland Kitchen PARoxetine (PAXIL) 40 MG tablet Take 60 mg by mouth daily.    . valsartan (DIOVAN) 160 MG tablet Take 160 mg by mouth daily.    Marland Kitchen warfarin (COUMADIN) 10 MG tablet Take 10 mg by mouth See admin instructions. Take 5 mg Mon. Tues. Thurs and, Sat   Take 10 mg  Wed. Sun. Fri  Take in the morning     No current facility-administered medications for this visit.    Allergies:   Diphenhydramine hcl, Atorvastatin, and Prednisone   Social History:  The patient  reports that he has quit smoking. He uses smokeless tobacco. He reports previous alcohol use. He reports that he does not use drugs.   Family History:  The patient's family history includes Diabetes in his mother; Heart disease in his mother.   ROS:  Please see the history of present illness.   Otherwise, review of systems is positive for none.   All other systems are reviewed and negative.   PHYSICAL EXAM: VS:  BP (!) 132/94   Pulse 87   Ht 5\' 10"  (1.778 m)   Wt )  436 lb (197.8 kg)   SpO2 96%   BMI 62.56 kg/m  , BMI Body mass index is 62.56 kg/m. GEN: Well nourished, well developed, in no acute distress  HEENT: normal  Neck: no JVD, carotid bruits, or masses Cardiac: RRR; no murmurs, rubs, or gallops,no edema  Respiratory:  clear to auscultation bilaterally, normal work of breathing GI: soft, nontender, nondistended, + BS MS: no deformity or atrophy  Skin: warm and dry Neuro:  Strength and sensation are intact Psych: euthymic mood, full affect  EKG:  EKG is ordered today. Personal review of the ekg ordered shows sinus rhythm  Recent Labs: No results found for requested labs within last 8760 hours.    Lipid Panel  No results found for:  CHOL, TRIG, HDL, CHOLHDL, VLDL, LDLCALC, LDLDIRECT   Wt Readings from Last 3 Encounters:  11/09/20 (!) 436 lb (197.8 kg)  08/24/20 (!) 448 lb 1.3 oz (203.2 kg)  12/12/19 (!) 450 lb (204.1 kg)      Other studies Reviewed: Additional studies/ records that were reviewed today include: TTE 05/09/18  Review of the above records today demonstrates:  - Left ventricle: The cavity size was normal. Wall thickness was   increased in a pattern of mild LVH. Systolic function was normal.   The estimated ejection fraction was in the range of 55% to 60%.   Although no diagnostic regional wall motion abnormality was   identified, this possibility cannot be completely excluded on the   basis of this study. The study was not technically sufficient to   allow evaluation of LV diastolic dysfunction due to atrial   fibrillation. - Aortic valve: There was no stenosis. - Mitral valve: Mildly calcified annulus. There was no significant   regurgitation. - Right ventricle: Poorly visualized. The cavity size was normal.   Systolic function was normal. - Pulmonary arteries: No complete TR doppler jet so unable to   estimate PA systolic pressure. - Systemic veins: IVC poorly visualized.   ASSESSMENT AND PLAN:  1.  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  CHA2DS2-VASc of 1.  Has flecainide for a pill in the pocket approach.  He did pop into atrial fibrillation once that lasted for 5 hours.  He did not take a dose of his flecainide.  He currently feels well and is without complaint.  No changes.  2.  Obstructive sleep apnea: CPAP compliance encouraged  3.  Morbid obesity: Diet and exercise encouraged  4.  Hypertension: Well-controlled today.  He does have occasional blood pressures that are elevated.  They were elevated this morning but he did not take his dose of carvedilol last night.  No changes.  Current medicines are reviewed at length with the patient today.   The patient does  not have concerns regarding his medicines.  The following changes were made today: None  Labs/ tests ordered today include:  Orders Placed This Encounter  Procedures  . EKG 12-Lead     Disposition:   FU with Jahid Weida 12 months  Signed, Esequiel Kleinfelter Jorja Loa, MD  11/09/2020 3:27 PM     [facility] [address] [place] [address] [phone] (office) [phone] (fax)

## 2020-11-11 ENCOUNTER — Telehealth: Payer: Self-pay | Admitting: Cardiology

## 2020-11-11 MED ORDER — FLECAINIDE ACETATE 100 MG PO TABS: 300.0000 mg | ORAL_TABLET | Freq: Once | ORAL | 0 refills | Status: DC | PRN

## 2020-11-11 NOTE — Telephone Encounter (Signed)
Pt reports being out of rhythm since he awoke this morning around 4:30 am. BP currently returned to normal and HR is currently 78, but still out of rhythm. He is asymptomatic. Pt has NOT taken his Flecainide pill in pocket -- pt advised to take now.  (sent in refills per pt request as he only has out dated medication) Aware to call office if out of rhythm for more than 24-48 hours and/or symptoms begin/worsen.  Also advised to call if AFib becomes more frequent. Patient verbalized understanding and agreeable to plan.

## 2020-11-11 NOTE — Telephone Encounter (Signed)
Patient c/o Palpitations:  High priority if patient c/o lightheadedness, shortness of breath, or chest pain  1) How long have you had palpitations/irregular HR/ Afib? Are you having the symptoms now? Patient states when he woke up this morning he was in afib and he has been in afib ever since   2) Are you currently experiencing lightheadedness, SOB or CP? No   3) Do you have a history of afib (atrial fibrillation) or irregular heart rhythm? Yes   4) Have you checked your BP or HR? (document readings if available):   11/11/20:  BP - 175/132 HR - 105         156/100            93  5) Are you experiencing any other symptoms? Sweats

## 2020-11-24 ENCOUNTER — Other Ambulatory Visit: Payer: Self-pay | Admitting: Cardiology

## 2021-01-29 ENCOUNTER — Telehealth: Payer: Self-pay | Admitting: Cardiovascular Disease

## 2021-01-29 ENCOUNTER — Encounter: Payer: Self-pay | Admitting: Cardiology

## 2021-01-29 ENCOUNTER — Telehealth (INDEPENDENT_AMBULATORY_CARE_PROVIDER_SITE_OTHER): Payer: BC Managed Care – PPO | Admitting: Cardiovascular Disease

## 2021-01-29 NOTE — Telephone Encounter (Signed)
Joshua Mcgee from [facility]: Joshua Mcgee Hematology states Dr. Gypsy Lore is calling to speak with DOD.

## 2021-01-29 NOTE — Telephone Encounter (Signed)
    Patient is at his heme-onc doctor.  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  His hematologist needs to start him on steroids but Travaris's been told not to take steroids in the past because it caused atrial fibrillation.  After discussing with the hematologist, it is clear that there is no choice but to start him on steroid. He has flecainide to take as "pill in the pocket".  The hematologist and I agree that at this point this is the best course of therapy.  He will follow-up with Dr. Elberta Fortis in the upcoming weeks.     Kristeen Miss, MD  01/29/2021 3:30 PM    [facility] [address] [place],  [address] [place], [place]  [address] Phone: [phone]; Fax: [phone]

## 2021-01-29 NOTE — Telephone Encounter (Signed)
error 

## 2021-01-29 NOTE — Telephone Encounter (Signed)
Dr. Elease Hashimoto spoke to provider

## 2021-02-22 ENCOUNTER — Telehealth: Payer: Self-pay | Admitting: Cardiology

## 2021-02-22 MED ORDER — FLECAINIDE ACETATE 100 MG PO TABS: 300.0000 mg | ORAL_TABLET | Freq: Once | ORAL | 3 refills | Status: DC | PRN

## 2021-02-22 NOTE — Telephone Encounter (Signed)
   *  STAT* If patient is at the pharmacy, call can be transferred to refill team.   1. Which medications need to be refilled? (please list name of each medication and dose if known)   flecainide (TAMBOCOR) 100 MG tablet    2. Which pharmacy/location (including street and city if local pharmacy) is medication to be sent to? [facility] - [place] - [address]  3. Do they need a 30 day or 90 day supply? 15 pills for Afib

## 2021-02-22 NOTE — Telephone Encounter (Signed)
Pt is requesting a refill on flecainide. Would Dr. Elberta Fortis like to refill this medication? Please address

## 2021-02-22 NOTE — Telephone Encounter (Signed)
Pt's medication was sent to pt's pharmacy as requested. Confirmation received.  °

## 2021-02-22 NOTE — Telephone Encounter (Signed)
Ok to refill this PRN medication under Dr. Elberta Fortis.

## 2021-04-09 ENCOUNTER — Telehealth: Payer: Self-pay | Admitting: Cardiology

## 2021-04-09 NOTE — Telephone Encounter (Signed)
Pt reports that he feels like he is experiencing AFib since being on steroids. States that Dr. Gypsy Lore started this r/t CA. Pt reports that he is able to do things, but does experience SOB w/ activity.  Other than that no symptoms. Informed pt that per Chinnasami's note it looks as they are tapering pt down (he is only taking 5 mg/day currently).  Advised to discuss w/ oncology and if going to be off steroids soon then this may be moot point, but if ongoing for period of time then will discuss options w/ Dr. Elberta Fortis. Pt and I agreed to speak next week and then discuss w/ MD if needed. Pt agreeable to plan.

## 2021-04-09 NOTE — Telephone Encounter (Signed)
Pt is stating that steroid meds that he takes messes with his heart.Joshua Mcgee as a precaution his cancer doctor put him on a low dose, however pt states that he feels as if he is going into afib.Joshua Mcgee would like to know recommendation.. please advise

## 2021-07-01 ENCOUNTER — Telehealth: Payer: Self-pay | Admitting: Cardiology

## 2021-07-01 NOTE — Telephone Encounter (Signed)
Pt informed of Dr. Elberta Fortis recommendation. Aware office will call to schedule Pt agreeable to plan.

## 2021-07-01 NOTE — Telephone Encounter (Addendum)
Pt reports being out of rhythm since 6 pm last night. He can feel it racing but denies any symptoms.  No CP, SOB, dizziness/light headedness. He woke up this morning around 2 am to go to the bathroom, still in Afib and took his PRN Flecainide. HRs 90-low 100s, He did not feel good last week up until yesterday, and then afib started. He is currently undergoing immunotherapy cancer tx and understands that this may exacerbate his afib. Pt is also taking Prednisone 20 mg daily.  Explained that most likely we will watchful wait for now. Advised that if out of rhythm for more than 24 hrs to let us know. Advised to call afib clinic tomorrow if still out of rhythm and/or symptoms arise r/t afib. Will forward to MD for advisement

## 2021-07-01 NOTE — Telephone Encounter (Signed)
Patient c/o Palpitations:  High priority if patient c/o lightheadedness, shortness of breath, or chest pain  How long have you had palpitations/irregular HR/ Afib? Are you having the symptoms now? Since last night and patient is having now.  Are you currently experiencing lightheadedness, SOB or CP? No   Do you have a history of afib (atrial fibrillation) or irregular heart rhythm? yes  Have you checked your BP or HR? (document readings if available): BP 130/85   Are you experiencing any other symptoms? Nol

## 2021-08-02 ENCOUNTER — Ambulatory Visit (INDEPENDENT_AMBULATORY_CARE_PROVIDER_SITE_OTHER): Payer: BC Managed Care – PPO | Admitting: Cardiology

## 2021-08-02 ENCOUNTER — Other Ambulatory Visit: Payer: Self-pay

## 2021-08-02 ENCOUNTER — Encounter: Payer: Self-pay | Admitting: Cardiology

## 2021-08-02 VITALS — BP 150/86 | HR 85 | Ht 71.0 in | Wt >= 6400 oz

## 2021-08-02 DIAGNOSIS — I480 Paroxysmal atrial fibrillation: Secondary | ICD-10-CM | POA: Diagnosis not present

## 2021-08-02 NOTE — Progress Notes (Signed)
Electrophysiology Office Note   Date:  08/02/2021   ID:  Joshua Mcgee, DOB [birth year], MRN [id number]  PCP:  Isabella Bowens, PA-C  Cardiologist:  [name] Primary Electrophysiologist:  [name], MD    No chief complaint on file.    History of Present Illness: Joshua Mcgee is a 55 y.o. male who is being seen today for the evaluation of atrial fibrillation at the request of [name]. Presenting today for electrophysiology evaluation.    He has a history significant for hypertension, morbid obesity, OSA on CPAP, and atrial fibrillation.  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  He was seen in the emergency room 05/10/2018 with 3 unsuccessful attempts at cardioversion.  He was given 300 mg flecainide which converted him to normal rhythm.  Today, denies symptoms of palpitations, chest pain, shortness of breath, orthopnea, PND, lower extremity edema, claudication, dizziness, presyncope, syncope, bleeding, or neurologic sequela. The patient is tolerating medications without difficulties.  Since last being seen he has done well.  Unfortunately he has been started on steroids due to cancer.  He has been gaining weight.  He previously lost up to 40 pounds.   Past Medical History:  Diagnosis Date   Atrial fibrillation (HCC)    on Coumadin   Chronic anticoagulation    Depression    Hypertension    OSA on CPAP    History reviewed. No pertinent surgical history.   Current Outpatient Medications  Medication Sig Dispense Refill   albuterol (PROVENTIL HFA;VENTOLIN HFA) 108 (90 Base) MCG/ACT inhaler Inhale 2 puffs into the lungs every 4 (four) hours as needed.     carvedilol (COREG) 25 MG tablet TAKE 1 TABLET BY MOUTH 2 TIMES A DAY 180 tablet 3   flecainide (TAMBOCOR) 100 MG tablet Take 3 tablets (300 mg total) by mouth Once PRN (Take only if you have atrial fibrillation). Do not repeat dose within 4 days. 15 tablet 3   omeprazole (PRILOSEC) 20 MG capsule Take 20 mg by mouth  daily.     PARoxetine (PAXIL) 40 MG tablet Take 60 mg by mouth daily.     valsartan (DIOVAN) 160 MG tablet Take 160 mg by mouth daily.     warfarin (COUMADIN) 10 MG tablet Take 10 mg by mouth See admin instructions. Take 5 mg Mon. Tues. Thurs and, Sat   Take 10 mg  Wed. Sun. Fri  Take in the morning     No current facility-administered medications for this visit.    Allergies:   Diphenhydramine hcl, Atorvastatin, and Prednisone   Social History:  The patient  reports that he has quit smoking. He uses smokeless tobacco. He reports that he does not currently use alcohol. He reports that he does not use drugs.   Family History:  The patient's family history includes Diabetes in his mother; Heart disease in his mother.   ROS:  Please see the history of present illness.   Otherwise, review of systems is positive for none.   All other systems are reviewed and negative.   PHYSICAL EXAM: VS:  BP (!) 150/86   Pulse 85   Ht 5\' 11"  (1.803 m)   Wt (!) 440 lb (199.6 kg)   SpO2 95%   BMI 61.37 kg/m  , BMI Body mass index is 61.37 kg/m. GEN: Well nourished, well developed, in no acute distress  HEENT: normal  Neck: no JVD, carotid bruits, or masses Cardiac: RRR; no murmurs, rubs, or gallops,no edema  Respiratory:  clear to auscultation bilaterally, normal work of breathing GI: soft, nontender, nondistended, + BS MS: no deformity or atrophy  Skin: warm and dry Neuro:  Strength and sensation are intact Psych: euthymic mood, full affect  EKG:  EKG is ordered today. Personal review of the ekg ordered shows sinus rhythm, rate 86  Recent Labs: No results found for requested labs within last 8760 hours.    Lipid Panel  No results found for: CHOL, TRIG, HDL, CHOLHDL, VLDL, LDLCALC, LDLDIRECT   Wt Readings from Last 3 Encounters:  08/02/21 (!) 440 lb (199.6 kg)  11/09/20 (!) 436 lb (197.8 kg)  08/24/20 (!) 448 lb 1.3 oz (203.2 kg)      Other studies Reviewed: Additional studies/  records that were reviewed today include: TTE 05/09/18  Review of the above records today demonstrates:  - Left ventricle: The cavity size was normal. Wall thickness was   increased in a pattern of mild LVH. Systolic function was normal.   The estimated ejection fraction was in the range of 55% to 60%.   Although no diagnostic regional wall motion abnormality was   identified, this possibility cannot be completely excluded on the   basis of this study. The study was not technically sufficient to   allow evaluation of LV diastolic dysfunction due to atrial   fibrillation. - Aortic valve: There was no stenosis. - Mitral valve: Mildly calcified annulus. There was no significant   regurgitation. - Right ventricle: Poorly visualized. The cavity size was normal.   Systolic function was normal. - Pulmonary arteries: No complete TR doppler jet so unable to   estimate PA systolic pressure. - Systemic veins: IVC poorly visualized.   ASSESSMENT AND PLAN:  1.  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  CHA2DS2-VASc of 1.  Has flecainide with a pill in the pocket approach.  He is remained in normal rhythm.  Continue with current management.  2.  Obstructive sleep apnea: CPAP compliance encouraged  3.  Morbid obesity: Diet and exercise encouraged  4.  Hypertension: Mildly elevated.  Usually well controlled.  No changes.  Current medicines are reviewed at length with the patient today.   The patient does not have concerns regarding his medicines.  The following changes were made today: None  Labs/ tests ordered today include:  Orders Placed This Encounter  Procedures   EKG 12-Lead      Disposition:   FU with Grason Brailsford 6 months  Signed, Nefi Musich Meredith Leeds, MD  08/02/2021 3:54 PM     [facility] [address] [place] [address] [phone] (office) [phone] (fax)

## 2021-09-08 ENCOUNTER — Telehealth: Payer: Self-pay | Admitting: *Deleted

## 2021-09-08 NOTE — Telephone Encounter (Signed)
Pt is low risk to hold anticoag from an afib perspective (CHADS2VASc score is 1 from HTN). On lifelong warfarin for DVT. 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 His warfarin managed at [facility]. Would defer to them - if he only had a single DVT in the remote past, would be ok to hold without bridging, but if they confirm hx of recurrent VTE, would recommend Lovenox bridge to be coordinated by provider managing his warfarin.

## 2021-09-08 NOTE — Telephone Encounter (Signed)
   [facility] Pre-operative Risk Assessment    Patient Name: Joshua Mcgee  DOB: 07/02/1966 MRN: [id number]  HEARTCARE STAFF:  - IMPORTANT!!!!!! Under Visit Info/Reason for Call, type in Other and utilize the format Clearance MM/DD/YY or Clearance TBD. Do not use dashes or single digits. - Please review there is not already an duplicate clearance open for this procedure. - If request is for dental extraction, please clarify the # of teeth to be extracted. - If the patient is currently at the dentist's office, call Pre-Op Callback Staff (MA/nurse) to input urgent request.  - If the patient is not currently in the dentist office, please route to the Pre-Op pool.  Request for surgical clearance:  What type of surgery is being performed?  COLONOSCOPY  When is this surgery scheduled?  TBD  What type of clearance is required (medical clearance vs. Pharmacy clearance to hold med vs. Both)?  BOTH  Are there any medications that need to be held prior to surgery and how long?  COUMADIN X'S 5 DAYS  Practice name and name of physician performing surgery?  [facility] / DR. LE  What is the office phone number?   [phone]   7.   What is the office fax number?  [phone]  8.   Anesthesia type (None, local, MAC, general) ?     Jeanann Lewandowsky 09/08/2021, 9:18 AM  _________________________________________________________________   (provider comments below)

## 2021-09-08 NOTE — Telephone Encounter (Signed)
We don't manage her coumadin (DVT and Afib), but we do see her for Afib. Would we give hold recommendations?

## 2021-09-09 NOTE — Telephone Encounter (Signed)
   Name: Mckinnon Glick  DOB: 1966-09-24  MRN: [id number]   Primary Cardiologist: EP - Dr. Elberta Fortis  Chart reviewed as part of pre-operative protocol coverage. Patient was contacted 09/09/2021 in reference to pre-operative risk assessment for pending surgery as outlined below.  Trusten Hume was last seen on 08/02/21 by Dr. Elberta Fortis. He is followed by cardiology for history of atrial fibrillation. Also notable history of DVTs, OSA on CPAP, HTN and severe morbid obesity outlined in chart.  Medical: I reached out to patient for update on how he is doing. The patient affirms he has been doing well without any new cardiac symptoms. Therefore, based on ACC/AHA guidelines, the patient would be at acceptable risk for the planned procedure without further cardiovascular testing. The patient was advised that if he develops new symptoms prior to surgery to contact our office to arrange for a follow-up visit, and he verbalized understanding.  Anticoagulation: Per review with our pharmacy team, patient would be at low risk to hold anticoagulation from afib perspective but also appears he is on Coumadin due to history of DVT which we do not manage for this patient. His warfarin managed at [facility] therefore would recommend the GI team reach out to them for final input on holding/bridging.  I will route this recommendation to the requesting party via Epic fax function and remove from pre-op pool. Please call with questions.  Laurann Montana, PA-C 09/09/2021, 11:37 AM

## 2021-12-28 ENCOUNTER — Other Ambulatory Visit: Payer: Self-pay | Admitting: Cardiology

## 2022-02-17 ENCOUNTER — Telehealth: Payer: Self-pay | Admitting: Cardiology

## 2022-02-17 NOTE — Telephone Encounter (Signed)
Spoke with the patient who states that he smoked a pipe and he thinks that it caused him to go into A fib. He took 300 mg of flecainide at 4:30am this morning. He states that he is feeling better, still some slight fluttering in his chest but it has improved. He states that it usually takes a bit of time for it to calm down. He has not taken his heart rate. Advised to call back if he feels like he has gone back into afib or has any other symptoms or concerns prior to his appointment on Monday.

## 2022-02-17 NOTE — Telephone Encounter (Signed)
When calling to remind patient about his appointment for Monday, patient states that he went into Afib last night and had to take a flecainide. I just let him know I would message yall and let you know. If you like to give him a call his number is [phone]

## 2022-02-21 ENCOUNTER — Encounter: Payer: Self-pay | Admitting: Cardiology

## 2022-02-21 ENCOUNTER — Ambulatory Visit (INDEPENDENT_AMBULATORY_CARE_PROVIDER_SITE_OTHER): Payer: BC Managed Care – PPO | Admitting: Cardiology

## 2022-02-21 VITALS — BP 126/66 | HR 77 | Ht 71.0 in | Wt >= 6400 oz

## 2022-02-21 DIAGNOSIS — I480 Paroxysmal atrial fibrillation: Secondary | ICD-10-CM | POA: Diagnosis not present

## 2022-02-21 NOTE — Patient Instructions (Signed)
Medication Instructions:  Your physician recommends that you continue on your current medications as directed. Please refer to the Current Medication list given to you today.  *If you need a refill on your cardiac medications before your next appointment, please call your pharmacy*   Lab Work: None ordered   Testing/Procedures: None ordered   Follow-Up: At [facility], you and your health needs are our priority.  As part of our continuing mission to provide you with exceptional heart care, we have created designated Provider Care Teams.  These Care Teams include your primary Cardiologist (physician) and Advanced Practice Providers (APPs -  Physician Assistants and Nurse Practitioners) who all work together to provide you with the care you need, when you need it.  Your next appointment:   1 year(s)  The format for your next appointment:   In Person  Provider:   Will Camnitz, MD    Thank you for choosing [facility]!!   Areyanna Figeroa, RN [phone]  Other Instructions   Important Information About Sugar           

## 2022-02-21 NOTE — Progress Notes (Signed)
Electrophysiology Office Note   Date:  02/21/2022   ID:  Joshua Mcgee, DOB [birth year], MRN [id number]  PCP:  Isabella Bowens, PA-C  Cardiologist:  [name] Primary Electrophysiologist:  [name], MD    No chief complaint on file.     History of Present Illness: Joshua Mcgee is a 56 y.o. male who is being seen today for the evaluation of atrial fibrillation at the request of [name]. Presenting today for electrophysiology evaluation.    He has a history significant for hypertension, morbid obesity, OSA on CPAP, atrial fibrillation.  He has DVTs and is on Coumadin.  He was seen in the emergency room 05/10/2018 with 3 unsuccessful attempts at cardioversion.  He was given 300 mg of flecainide which converted him to normal rhythm.  He is now on a pill in the pocket approach to his atrial fibrillation  Today, denies symptoms of palpitations, chest pain, shortness of breath, orthopnea, PND, lower extremity edema, claudication, dizziness, presyncope, syncope, bleeding, or neurologic sequela. The patient is tolerating medications without difficulties.  Being seen he has done well.  He continues to have minimal episodes of atrial fibrillation.  He has atrial fibrillation once every 6 or 7 months.  His most recent episode, he was smoking a pipe.  He did not inhale.  He thinks the nicotine caused his atrial fibrillation.  He has not smoked anymore.  Past Medical History:  Diagnosis Date   Atrial fibrillation (HCC)    on Coumadin   Chronic anticoagulation    Depression    Hypertension    OSA on CPAP    History reviewed. No pertinent surgical history.   Current Outpatient Medications  Medication Sig Dispense Refill   albuterol (PROVENTIL HFA;VENTOLIN HFA) 108 (90 Base) MCG/ACT inhaler Inhale 2 puffs into the lungs every 4 (four) hours as needed.     carvedilol (COREG) 25 MG tablet TAKE 1 TABLET BY MOUTH 2 TIMES A DAY 180 tablet 1   flecainide (TAMBOCOR) 100 MG tablet Take 3  tablets (300 mg total) by mouth Once PRN (Take only if you have atrial fibrillation). Do not repeat dose within 4 days. 15 tablet 3   omeprazole (PRILOSEC) 20 MG capsule Take 20 mg by mouth daily.     PARoxetine (PAXIL) 40 MG tablet Take 60 mg by mouth daily.     valsartan (DIOVAN) 160 MG tablet Take 160 mg by mouth daily.     warfarin (COUMADIN) 10 MG tablet Take 10 mg by mouth See admin instructions. Take 5 mg Mon. Tues. Thurs and, Sat   Take 10 mg  Wed. Sun. Fri  Take in the morning     glipiZIDE (GLUCOTROL XL) 5 MG 24 hr tablet Take 5 mg by mouth daily.     No current facility-administered medications for this visit.    Allergies:   Diphenhydramine hcl, Atorvastatin, and Prednisone   Social History:  The patient  reports that he has quit smoking. He uses smokeless tobacco. He reports that he does not currently use alcohol. He reports that he does not use drugs.   Family History:  The patient's family history includes Diabetes in his mother; Heart disease in his mother.   ROS:  Please see the history of present illness.   Otherwise, review of systems is positive for none.   All other systems are reviewed and negative.   PHYSICAL EXAM: VS:  BP 126/66   Pulse 77   Ht 5\' 11"  (1.803 m)  Wt (!) 443 lb (200.9 kg)   SpO2 96%   BMI 61.79 kg/m  , BMI Body mass index is 61.79 kg/m. GEN: Well nourished, well developed, in no acute distress  HEENT: normal  Neck: no JVD, carotid bruits, or masses Cardiac: RRR; no murmurs, rubs, or gallops,no edema  Respiratory:  clear to auscultation bilaterally, normal work of breathing GI: soft, nontender, nondistended, + BS MS: no deformity or atrophy  Skin: warm and dry Neuro:  Strength and sensation are intact Psych: euthymic mood, full affect  EKG:  EKG is ordered today. Personal review of the ekg ordered shows sinus rhythm, rate 77  Recent Labs: No results found for requested labs within last 8760 hours.    Lipid Panel  No results  found for: CHOL, TRIG, HDL, CHOLHDL, VLDL, LDLCALC, LDLDIRECT   Wt Readings from Last 3 Encounters:  02/21/22 (!) 443 lb (200.9 kg)  08/02/21 (!) 440 lb (199.6 kg)  11/09/20 (!) 436 lb (197.8 kg)      Other studies Reviewed: Additional studies/ records that were reviewed today include: TTE 05/09/18  Review of the above records today demonstrates:  - Left ventricle: The cavity size was normal. Wall thickness was   increased in a pattern of mild LVH. Systolic function was normal.   The estimated ejection fraction was in the range of 55% to 60%.   Although no diagnostic regional wall motion abnormality was   identified, this possibility cannot be completely excluded on the   basis of this study. The study was not technically sufficient to   allow evaluation of LV diastolic dysfunction due to atrial   fibrillation. - Aortic valve: There was no stenosis. - Mitral valve: Mildly calcified annulus. There was no significant   regurgitation. - Right ventricle: Poorly visualized. The cavity size was normal.   Systolic function was normal. - Pulmonary arteries: No complete TR doppler jet so unable to   estimate PA systolic pressure. - Systemic veins: IVC poorly visualized.   ASSESSMENT AND PLAN:  1.  Paroxysmal atrial fibrillation: CHA2DS2-VASc of 1.  Currently on Coumadin for DVTs.  Has flecainide as a pill in the pocket approach.  Has remained in sinus rhythm.  Continue with current management.  2.  Obstructive sleep apnea: CPAP compliance encouraged  3.  Morbid obesity: Body mass index is 61.79 kg/m. Diet and exercise encouraged  4.  Hypertension: Well-controlled   Current medicines are reviewed at length with the patient today.   The patient does not have concerns regarding his medicines.  The following changes were made today: None  Labs/ tests ordered today include:  No orders of the defined types were placed in this encounter.     Disposition:   FU with Darrall Strey 12  months  Signed, Jamarii Banks Jorja Loa, MD  02/21/2022 3:56 PM     [facility] [address] [place] [address] [phone] (office) [phone] (fax)

## 2022-02-23 NOTE — Addendum Note (Signed)
Addended by: Caedan Sumler M on: 02/23/2022 11:53 AM   Modules accepted: Orders  

## 2022-03-30 ENCOUNTER — Other Ambulatory Visit: Payer: Self-pay | Admitting: Cardiology

## 2022-03-31 NOTE — Telephone Encounter (Signed)
Rx refill sent to pharmacy. 

## 2022-04-19 NOTE — Progress Notes (Signed)
Primary Care Physician: Isabella Bowens, PA-C Primary Electrophysiologist: Dr Elberta Fortis Referring Physician: Dr Elberta Fortis   Joshua Mcgee is a 56 y.o. male with a history of HTN, OSA, DVT, atrial fibrillation who presents for follow up in the [facility].  He was seen in the emergency room 05/10/2018 with 3 unsuccessful attempts at cardioversion.  He was given 300 mg of flecainide which converted him to normal rhythm. Has been maintained on PIP flecainide. Patient is on warfarin for a CHADS2VASC score of 1.   On follow up today, patient states that he has ~5 episodes of afib per year treated with PIP flecainide. Last week, he had an episodes that lasted 12 hours which is longer than his usual episodes. He states that he had a very stressful day that day. There were no other specific triggers that he could identify.   Today, he denies symptoms of palpitations, chest pain, shortness of breath, orthopnea, PND, lower extremity edema, dizziness, presyncope, syncope, snoring, daytime somnolence, bleeding, or neurologic sequela. The patient is tolerating medications without difficulties and is otherwise without complaint today.    Atrial Fibrillation Risk Factors:  he does have symptoms or diagnosis of sleep apnea. he is compliant with CPAP therapy. he does not have a history of rheumatic fever.   he has a BMI of Body mass index is 61.62 kg/m.Marland Kitchen Filed Weights   04/20/22 0827  Weight: (!) 200.4 kg    Family History  Problem Relation Age of Onset   Heart disease Mother    Diabetes Mother      Atrial Fibrillation Management history:  Previous antiarrhythmic drugs: flecainide  Previous cardioversions: 2019 Previous ablations: none CHADS2VASC score: 1 Anticoagulation history: warfarin    Past Medical History:  Diagnosis Date   Atrial fibrillation (HCC)    on Coumadin   Chronic anticoagulation    Depression    Hypertension    OSA on CPAP    No past  surgical history on file.  Current Outpatient Medications  Medication Sig Dispense Refill   albuterol (PROVENTIL HFA;VENTOLIN HFA) 108 (90 Base) MCG/ACT inhaler Inhale 2 puffs into the lungs every 4 (four) hours as needed.     Calcium Carb-Cholecalciferol (CALCIUM 1000 + D PO) Take by mouth.     carvedilol (COREG) 25 MG tablet TAKE 1 TABLET BY MOUTH 2 TIMES A DAY 180 tablet 2   flecainide (TAMBOCOR) 100 MG tablet Take 3 tablets (300 mg total) by mouth Once PRN (Take only if you have atrial fibrillation). Do not repeat dose within 4 days. 15 tablet 3   Fluticasone-Umeclidin-Vilant 100-62.5-25 MCG/ACT AEPB Inhale 1 puff into the lungs daily.     glipiZIDE (GLUCOTROL XL) 5 MG 24 hr tablet Take 5 mg by mouth daily.     JANUVIA 50 MG tablet Take 50 mg by mouth daily.     montelukast (SINGULAIR) 10 MG tablet Take 10 mg by mouth daily.     omeprazole (PRILOSEC) 20 MG capsule Take 20 mg by mouth daily.     PARoxetine (PAXIL) 40 MG tablet Take 60 mg by mouth daily.     predniSONE (DELTASONE) 5 MG tablet Take 5 mg by mouth daily.     valsartan (DIOVAN) 320 MG tablet Take 320 mg by mouth daily.     warfarin (COUMADIN) 10 MG tablet Take 10 mg by mouth See admin instructions. Take 5 mg Mon. Tues. Thurs and, Sat   Take 10 mg  Wed. Sun. Fri  Take  in the morning     No current facility-administered medications for this encounter.    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  Physical Exam: Vitals:   04/20/22 0827  BP: 136/82  Pulse: 69  Weight: (!) 200.4 kg  Height: 5\' 11"  (1.803 m)    GEN- The patient is a well appearing obese male, alert and oriented x 3 today.   Head- normocephalic, atraumatic Eyes-  Sclera clear, conjunctiva pink Ears- hearing intact Oropharynx- clear Neck- supple  Lungs- Clear to ausculation bilaterally, normal work of breathing Heart- Regular rate and rhythm, no murmurs, rubs or gallops  GI- soft, NT, ND, + BS Extremities- no clubbing, cyanosis, or edema MS- no significant deformity or atrophy Skin- no rash or lesion Psych- euthymic mood, full affect Neuro- strength and sensation are intact  Wt Readings from Last 3 Encounters:  04/20/22 (!) 200.4 kg  02/21/22 (!) 200.9 kg  08/02/21 (!) 199.6 kg    EKG today demonstrates  SR Vent. rate 69 BPM PR interval 194 ms QRS duration 104 ms QT/QTcB 382/409 ms  Echo 05/09/18 demonstrated  Left ventricle: The cavity size was normal. Wall thickness was    increased in a pattern of mild LVH. Systolic function was normal.    The estimated ejection fraction was in the range of 55% to 60%.    Although no diagnostic regional wall motion abnormality was    identified, this possibility cannot be completely excluded on the    basis of this study. The study was not technically sufficient to    allow evaluation of LV  diastolic dysfunction due to atrial    fibrillation.  - Aortic valve: There was no stenosis.  - Mitral valve: Mildly calcified annulus. There was no significant    regurgitation.  - Right ventricle: Poorly visualized. The cavity size was normal.    Systolic function was normal.  - Pulmonary arteries: No complete TR doppler jet so unable to    estimate PA systolic pressure.  - Systemic veins: IVC poorly visualized.   Impressions:   - Technically difficult study with poor acoustic windows. Normal LV    size with mild LV hypertrophy. EF 55-60%.  Normal RV size and    systolic function. No significant valvular abnormalities.  Epic records are reviewed at length today  CHA2DS2-VASc Score = 1  The patient's score is based upon: CHF History: 0 HTN History: 1 Diabetes History: 0 Stroke History: 0 Vascular Disease History: 0 Age Score: 0 Gender Score: 0       ASSESSMENT AND PLAN: 1. Paroxysmal Atrial Fibrillation (ICD10:  I48.0) The patient's CHA2DS2-VASc score is 1, indicating a 0.6% annual risk of stroke.   We discussed rhythm control options today including taking flecainide BID vs changing AAD (dofetilide). His weight is a disincentive for ablation. He would like to continue present therapy for now and try taking BID flecainide if his episodes become more frequent.  Continue carvedilol 25 mg BID Continue warfarin   2. Obesity Body mass index is 61.62 kg/m. Lifestyle modification was discussed at length including regular exercise and weight reduction. Will refer to the [facility].   3. Obstructive sleep apnea The importance of adequate treatment of sleep apnea was discussed today in order to improve our ability to maintain sinus rhythm long term. Encouraged compliance with CPAP therapy.   4. HTN Stable, no changes today.   Follow up in the AF clinic in 3 months.    Jorja Loa PA-C Afib Clinic [facility] [address] [place], [place]  [address] [phone] 04/20/2022 8:39 AM

## 2022-04-20 ENCOUNTER — Ambulatory Visit (HOSPITAL_COMMUNITY)
Admission: RE | Admit: 2022-04-20 | Discharge: 2022-04-20 | Disposition: A | Discharge status: Home / Self Care | Payer: BC Managed Care – PPO | Source: Ambulatory Visit | Attending: Physician Assistant | Admitting: Physician Assistant

## 2022-04-20 ENCOUNTER — Encounter (HOSPITAL_COMMUNITY): Payer: Self-pay | Admitting: Physician Assistant

## 2022-04-20 VITALS — BP 136/82 | HR 69 | Ht 71.0 in | Wt >= 6400 oz

## 2022-04-20 DIAGNOSIS — I509 Heart failure, unspecified: Secondary | ICD-10-CM | POA: Insufficient documentation

## 2022-04-20 DIAGNOSIS — Z6841 Body Mass Index (BMI) 40.0 and over, adult: Secondary | ICD-10-CM | POA: Insufficient documentation

## 2022-04-20 DIAGNOSIS — Z79899 Other long term (current) drug therapy: Secondary | ICD-10-CM | POA: Diagnosis not present

## 2022-04-20 DIAGNOSIS — I480 Paroxysmal atrial fibrillation: Secondary | ICD-10-CM | POA: Insufficient documentation

## 2022-04-20 DIAGNOSIS — Z7901 Long term (current) use of anticoagulants: Secondary | ICD-10-CM | POA: Insufficient documentation

## 2022-04-20 DIAGNOSIS — I110 Hypertensive heart disease with heart failure: Secondary | ICD-10-CM | POA: Diagnosis not present

## 2022-04-20 DIAGNOSIS — Z7182 Exercise counseling: Secondary | ICD-10-CM | POA: Diagnosis not present

## 2022-04-20 DIAGNOSIS — G4733 Obstructive sleep apnea (adult) (pediatric): Secondary | ICD-10-CM | POA: Diagnosis not present

## 2022-04-20 DIAGNOSIS — E669 Obesity, unspecified: Secondary | ICD-10-CM | POA: Diagnosis not present

## 2022-07-22 ENCOUNTER — Encounter (HOSPITAL_COMMUNITY): Payer: Self-pay | Admitting: Physician Assistant

## 2022-07-22 ENCOUNTER — Ambulatory Visit (HOSPITAL_COMMUNITY)
Admission: RE | Admit: 2022-07-22 | Discharge: 2022-07-22 | Disposition: A | Discharge status: Home / Self Care | Payer: BC Managed Care – PPO | Source: Ambulatory Visit | Attending: Physician Assistant | Admitting: Physician Assistant

## 2022-07-22 VITALS — BP 136/88 | HR 75 | Ht 71.0 in | Wt >= 6400 oz

## 2022-07-22 DIAGNOSIS — Z7901 Long term (current) use of anticoagulants: Secondary | ICD-10-CM | POA: Diagnosis not present

## 2022-07-22 DIAGNOSIS — I480 Paroxysmal atrial fibrillation: Secondary | ICD-10-CM

## 2022-07-22 DIAGNOSIS — E669 Obesity, unspecified: Secondary | ICD-10-CM | POA: Insufficient documentation

## 2022-07-22 DIAGNOSIS — G4733 Obstructive sleep apnea (adult) (pediatric): Secondary | ICD-10-CM | POA: Diagnosis not present

## 2022-07-22 DIAGNOSIS — I110 Hypertensive heart disease with heart failure: Secondary | ICD-10-CM | POA: Diagnosis not present

## 2022-07-22 DIAGNOSIS — Z6841 Body Mass Index (BMI) 40.0 and over, adult: Secondary | ICD-10-CM | POA: Diagnosis not present

## 2022-07-22 DIAGNOSIS — Z79899 Other long term (current) drug therapy: Secondary | ICD-10-CM | POA: Diagnosis not present

## 2022-07-22 MED ORDER — FLECAINIDE ACETATE 100 MG PO TABS: 300.0000 mg | ORAL_TABLET | Freq: Once | ORAL | 3 refills | Status: DC | PRN

## 2022-07-22 NOTE — Progress Notes (Signed)
Primary Care Physician: Osie Cheeks, PA-C Primary Electrophysiologist: Dr Curt Bears Referring Physician: Dr Curt Bears   Joshua Mcgee is a 56 y.o. male with a history of HTN, OSA, DVT, atrial fibrillation who presents for follow up in the [facility].  He was seen in the emergency room 05/10/2018 with 3 unsuccessful attempts at cardioversion.  He was given 300 mg of flecainide which converted him to normal rhythm. Has been maintained on PIP flecainide. Patient is on warfarin for a CHADS2VASC score of 1.   On follow up today, patient reports that he has done very well since his last visit. He denies any tachypalpitations and has not had to use any PRN flecainide. No bleeding issues on anticoagulation.   Today, he denies symptoms of palpitations, chest pain, shortness of breath, orthopnea, PND, lower extremity edema, dizziness, presyncope, syncope, snoring, daytime somnolence, bleeding, or neurologic sequela. The patient is tolerating medications without difficulties and is otherwise without complaint today.    Atrial Fibrillation Risk Factors:  he does have symptoms or diagnosis of sleep apnea. he is compliant with CPAP therapy. he does not have a history of rheumatic fever.   he has a BMI of Body mass index is 61.62 kg/m.Marland Kitchen Filed Weights   07/22/22 0857  Weight: (!) 200.4 kg    Family History  Problem Relation Age of Onset   Heart disease Mother    Diabetes Mother      Atrial Fibrillation Management history:  Previous antiarrhythmic drugs: flecainide  Previous cardioversions: 2019 Previous ablations: none CHADS2VASC score: 1 Anticoagulation history: warfarin    Past Medical History:  Diagnosis Date   Atrial fibrillation ([place])    on Coumadin   Chronic anticoagulation    Depression    Hypertension    OSA on CPAP    No past surgical history on file.  Current Outpatient Medications  Medication Sig Dispense Refill   albuterol  (PROVENTIL HFA;VENTOLIN HFA) 108 (90 Base) MCG/ACT inhaler Inhale 2 puffs into the lungs every 4 (four) hours as needed.     Calcium Carb-Cholecalciferol (CALCIUM 1000 + D PO) Take by mouth.     carvedilol (COREG) 25 MG tablet TAKE 1 TABLET BY MOUTH 2 TIMES A DAY 180 tablet 2   flecainide (TAMBOCOR) 100 MG tablet Take 3 tablets (300 mg total) by mouth Once PRN (Take only if you have atrial fibrillation). Do not repeat dose within 4 days. 15 tablet 3   Fluticasone-Umeclidin-Vilant 100-62.5-25 MCG/ACT AEPB Inhale 1 puff into the lungs daily.     glipiZIDE (GLUCOTROL XL) 5 MG 24 hr tablet Take 5 mg by mouth daily.     glucose blood test strip TEST BLOOD SUGAR 2 TO 3 TIMES DAILY AS DIRECTED     JANUVIA 50 MG tablet Take 50 mg by mouth daily.     montelukast (SINGULAIR) 10 MG tablet Take 10 mg by mouth daily.     omeprazole (PRILOSEC) 20 MG capsule Take 20 mg by mouth daily.     PARoxetine (PAXIL) 40 MG tablet Take 60 mg by mouth daily.     predniSONE (DELTASONE) 5 MG tablet Take 5 mg by mouth daily.     valsartan (DIOVAN) 320 MG tablet Take 320 mg by mouth daily.     warfarin (COUMADIN) 10 MG tablet Take 10 mg by mouth See admin instructions. Take 5 mg Mon. Tues. Thurs and, Sat   Take 10 mg  Wed. Sun. Fri  Take in the morning  No current facility-administered medications for this encounter.    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  Physical Exam: Vitals:   07/22/22 0857  BP: 136/88  Pulse: 75  Weight: (!) 200.4 kg  Height: 5\' 11"  (1.803 m)     GEN- The patient is a well appearing obese male, alert and oriented x 3 today.   HEENT-head normocephalic, atraumatic, sclera clear, conjunctiva pink, hearing intact, trachea midline. Lungs- Clear to ausculation bilaterally, normal work of breathing Heart- Regular rate and rhythm, no murmurs, rubs or gallops  GI- soft, NT, ND, + BS Extremities- no clubbing, cyanosis, or edema MS- no significant deformity or atrophy Skin- no rash or lesion Psych- euthymic mood, full affect Neuro- strength and sensation are intact   Wt Readings from Last 3 Encounters:  07/22/22 (!) 200.4 kg  04/20/22 (!) 200.4 kg  02/21/22 (!) 200.9 kg    EKG today demonstrates  SR Vent. rate 75 BPM PR interval 194 ms QRS duration 98 ms QT/QTcB 382/426 ms  Echo 05/09/18 demonstrated  Left ventricle: The cavity size was normal. Wall thickness was    increased in a pattern of mild LVH. Systolic function was normal.    The estimated ejection fraction was in the range of 55% to 60%.    Although no diagnostic regional wall motion abnormality was    identified, this possibility cannot be completely excluded on the    basis of this study. The study was not technically sufficient to    allow evaluation of LV diastolic dysfunction due to atrial     fibrillation.  - Aortic valve: There was no stenosis.  - Mitral valve: Mildly calcified annulus. There was no significant    regurgitation.  - Right ventricle: Poorly visualized. The cavity size was normal.    Systolic function was normal.  - Pulmonary arteries: No complete TR doppler jet so unable to    estimate PA systolic pressure.  - Systemic veins: IVC poorly visualized.   Impressions:   - Technically difficult study with poor acoustic windows. Normal LV    size with mild LV hypertrophy. EF 55-60%. Normal RV size and    systolic function. No significant  valvular abnormalities.  Epic records are reviewed at length today  CHA2DS2-VASc Score = 1  The patient's score is based upon: CHF History: 0 HTN History: 1 Diabetes History: 0 Stroke History: 0 Vascular Disease History: 0 Age Score: 0 Gender Score: 0       ASSESSMENT AND PLAN: 1. Paroxysmal Atrial Fibrillation (ICD10:  I48.0) The patient's CHA2DS2-VASc score is 1, indicating a 0.6% annual risk of stroke.   Patient appears to be maintaining SR. Will continue PIP flecainide 300 mg q 4 days for afib.  He would like to continue present therapy for now and try taking BID flecainide if his episodes become more frequent.  Continue carvedilol 25 mg BID Continue warfarin   2. Obesity Body mass index is 61.62 kg/m. Lifestyle modification was discussed and encouraged including regular physical activity and weight reduction. [facility].  3. Obstructive sleep apnea Encouraged compliance with CPAP therapy.  4. HTN Stable, no changes today.   Follow up with Dr Curt Bears per recall. AF clinic in one year.    [facility] [address] [place], [place] [address] [phone] 07/22/2022 9:28 AM

## 2022-09-06 ENCOUNTER — Ambulatory Visit (HOSPITAL_COMMUNITY)
Admission: RE | Admit: 2022-09-06 | Discharge: 2022-09-06 | Disposition: A | Discharge status: Home / Self Care | Payer: BC Managed Care – PPO | Source: Ambulatory Visit | Attending: Physician Assistant | Admitting: Physician Assistant

## 2022-09-06 ENCOUNTER — Encounter (HOSPITAL_COMMUNITY): Payer: Self-pay | Admitting: Physician Assistant

## 2022-09-06 VITALS — BP 128/78 | HR 82 | Ht 71.0 in | Wt >= 6400 oz

## 2022-09-06 DIAGNOSIS — Z7182 Exercise counseling: Secondary | ICD-10-CM | POA: Diagnosis not present

## 2022-09-06 DIAGNOSIS — G4733 Obstructive sleep apnea (adult) (pediatric): Secondary | ICD-10-CM | POA: Insufficient documentation

## 2022-09-06 DIAGNOSIS — Z79899 Other long term (current) drug therapy: Secondary | ICD-10-CM | POA: Insufficient documentation

## 2022-09-06 DIAGNOSIS — I480 Paroxysmal atrial fibrillation: Secondary | ICD-10-CM | POA: Insufficient documentation

## 2022-09-06 DIAGNOSIS — R634 Abnormal weight loss: Secondary | ICD-10-CM | POA: Diagnosis not present

## 2022-09-06 DIAGNOSIS — Z6841 Body Mass Index (BMI) 40.0 and over, adult: Secondary | ICD-10-CM | POA: Insufficient documentation

## 2022-09-06 DIAGNOSIS — I509 Heart failure, unspecified: Secondary | ICD-10-CM | POA: Diagnosis not present

## 2022-09-06 DIAGNOSIS — I110 Hypertensive heart disease with heart failure: Secondary | ICD-10-CM | POA: Insufficient documentation

## 2022-09-06 DIAGNOSIS — E669 Obesity, unspecified: Secondary | ICD-10-CM | POA: Insufficient documentation

## 2022-09-06 DIAGNOSIS — Z7901 Long term (current) use of anticoagulants: Secondary | ICD-10-CM | POA: Insufficient documentation

## 2022-09-06 MED ORDER — FLECAINIDE ACETATE 100 MG PO TABS: 100.0000 mg | ORAL_TABLET | Freq: Two times a day (BID) | ORAL | 3 refills | Status: DC

## 2022-09-06 NOTE — Patient Instructions (Signed)
Start Flecainide 100mg twice a day 

## 2022-09-06 NOTE — Progress Notes (Signed)
Primary Care Physician: Isabella Bowens, PA-C Primary Electrophysiologist: Dr Elberta Fortis Referring Physician: Dr Elberta Fortis   Joshua Mcgee is a 56 y.o. male with a history of HTN, OSA, DVT, atrial fibrillation who presents for follow up in the [facility].  He was seen in the emergency room 05/10/2018 with 3 unsuccessful attempts at cardioversion.  He was given 300 mg of flecainide which converted him to normal rhythm. Has been maintained on PIP flecainide. Patient is on warfarin for a CHADS2VASC score of 1.   On follow up today, patient reports that since his last visit he has had to take PRN flecainide 3 times which is much more often then previously. There are no specific triggers that he can identify.   Today, he denies symptoms of palpitations, chest pain, shortness of breath, orthopnea, PND, lower extremity edema, dizziness, presyncope, syncope, snoring, daytime somnolence, bleeding, or neurologic sequela. The patient is tolerating medications without difficulties and is otherwise without complaint today.    Atrial Fibrillation Risk Factors:  he does have symptoms or diagnosis of sleep apnea. he is compliant with CPAP therapy. he does not have a history of rheumatic fever.   he has a BMI of Body mass index is 60.39 kg/m.Marland Kitchen Filed Weights   09/06/22 1510  Weight: (!) 196.4 kg    Family History  Problem Relation Age of Onset   Heart disease Mother    Diabetes Mother      Atrial Fibrillation Management history:  Previous antiarrhythmic drugs: flecainide  Previous cardioversions: 2019 Previous ablations: none CHADS2VASC score: 1 Anticoagulation history: warfarin    Past Medical History:  Diagnosis Date   Atrial fibrillation (HCC)    on Coumadin   Chronic anticoagulation    Depression    Hypertension    OSA on CPAP    No past surgical history on file.  Current Outpatient Medications  Medication Sig Dispense Refill   albuterol  (PROVENTIL HFA;VENTOLIN HFA) 108 (90 Base) MCG/ACT inhaler Inhale 2 puffs into the lungs every 4 (four) hours as needed.     Calcium Carb-Cholecalciferol (CALCIUM 1000 + D PO) Take by mouth.     carvedilol (COREG) 25 MG tablet TAKE 1 TABLET BY MOUTH 2 TIMES A DAY 180 tablet 2   flecainide (TAMBOCOR) 100 MG tablet Take 3 tablets (300 mg total) by mouth Once PRN (Take only if you have atrial fibrillation). Do not repeat dose within 4 days. 15 tablet 3   Fluticasone-Umeclidin-Vilant 100-62.5-25 MCG/ACT AEPB Inhale 1 puff into the lungs daily.     glipiZIDE (GLUCOTROL XL) 5 MG 24 hr tablet Take 5 mg by mouth daily.     glucose blood test strip TEST BLOOD SUGAR 2 TO 3 TIMES DAILY AS DIRECTED     montelukast (SINGULAIR) 10 MG tablet Take 10 mg by mouth daily.     omeprazole (PRILOSEC) 20 MG capsule Take 20 mg by mouth daily.     PARoxetine (PAXIL) 40 MG tablet Take 60 mg by mouth daily.     predniSONE (DELTASONE) 5 MG tablet Take 5 mg by mouth daily.     TRULICITY 1.5 MG/0.5ML SOPN Inject 1.5 mg into the skin once a week.     valsartan (DIOVAN) 320 MG tablet Take 320 mg by mouth daily.     warfarin (COUMADIN) 10 MG tablet Take 10 mg by mouth See admin instructions. Take 5 mg Mon. Tues. Thurs and, Sat   Take 10 mg  Wed. Sun. Fri  Take  in the morning     No current facility-administered medications for this encounter.    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   Vaping Use   Vaping Use: Never used  Substance and Sexual Activity   Alcohol use: Yes    Alcohol/week: 12.0 standard drinks of alcohol    Types: 12 Cans of beer per week    Comment: 12 pack a year 09/06/22   Drug use: Never   Sexual activity: Not on file  Other Topics Concern   Not on file  Social History Narrative   Not on file   Social Determinants of Health   Financial Resource Strain: Not on file  Food Insecurity: Not on file  Transportation Needs: Not on file  Physical Activity: Not on file  Stress: Not on file  Social Connections: Not on file  Intimate Partner Violence: Not on file     ROS- All systems are reviewed and negative except as per the HPI above.  Physical Exam: Vitals:   09/06/22 1510  BP: 128/78  Pulse: 82  Weight: (!) 196.4 kg  Height: 5\' 11"  (1.803 m)    GEN- The patient is a well appearing obese male, alert and oriented x 3 today.   HEENT-head normocephalic, atraumatic, sclera clear, conjunctiva pink, hearing intact, trachea midline. Lungs- Clear to ausculation bilaterally, normal work of breathing Heart- Regular rate and rhythm, no murmurs, rubs or gallops  GI- soft, NT, ND, + BS Extremities- no clubbing, cyanosis, or edema MS- no significant deformity or atrophy Skin- no rash or lesion Psych- euthymic mood, full affect Neuro- strength and sensation are intact   Wt Readings from Last 3 Encounters:  09/06/22 (!) 196.4 kg  07/22/22 (!) 200.4 kg  04/20/22 (!) 200.4 kg    EKG today demonstrates  SR Vent. rate 82 BPM PR interval 190 ms QRS duration 90 ms QT/QTcB 358/418 ms  Echo 05/09/18 demonstrated  Left ventricle: The cavity size was normal. Wall thickness was    increased in a pattern of mild LVH. Systolic function was normal.    The estimated ejection fraction was in the range of 55% to 60%.    Although no diagnostic regional wall motion abnormality was    identified, this possibility cannot be completely excluded on the     basis of this study. The study was not technically sufficient to    allow evaluation of LV diastolic dysfunction due to atrial    fibrillation.  - Aortic valve: There was no stenosis.  - Mitral valve: Mildly calcified annulus. There was no significant    regurgitation.  - Right ventricle: Poorly visualized. The cavity size was normal.    Systolic function was normal.  - Pulmonary arteries: No complete TR doppler jet so unable to    estimate PA systolic pressure.  - Systemic veins: IVC poorly visualized.   Impressions:   -  Technically difficult study with poor acoustic windows. Normal LV    size with mild LV hypertrophy. EF 55-60%. Normal RV size and    systolic function. No significant valvular abnormalities.  Epic records are reviewed at length today  CHA2DS2-VASc Score = 1  The patient's score is based upon: CHF History: 0 HTN History: 1 Diabetes History: 0 Stroke History: 0 Vascular Disease History: 0 Age Score: 0 Gender Score: 0       ASSESSMENT AND PLAN: 1. Paroxysmal Atrial Fibrillation (ICD10:  I48.0) The patient's CHA2DS2-VASc score is 1, indicating a 0.6% annual risk of stroke.   We discussed rhythm control options today.  Will start flecainide 100 mg BID. Patient to return for ECG next week.  Continue carvedilol 25 mg BID Continue warfarin   2. Obesity Body mass index is 60.39 kg/m. Lifestyle modification was discussed and encouraged including regular physical activity and weight reduction. Referred to [facility].  3. Obstructive sleep apnea Encouraged compliance with CPAP therapy.  4. HTN Stable, no changes today.   Follow up in the AF clinic next week.    Jorja Loa PA-C Afib Clinic [facility] [address] [place], [place] [address] [phone] 09/06/2022 3:42 PM

## 2022-09-13 ENCOUNTER — Ambulatory Visit (HOSPITAL_COMMUNITY)
Admission: RE | Admit: 2022-09-13 | Discharge: 2022-09-13 | Disposition: A | Discharge status: Home / Self Care | Payer: BC Managed Care – PPO | Source: Ambulatory Visit | Attending: Physician Assistant | Admitting: Physician Assistant

## 2022-09-13 DIAGNOSIS — I440 Atrioventricular block, first degree: Secondary | ICD-10-CM | POA: Insufficient documentation

## 2022-09-13 DIAGNOSIS — I480 Paroxysmal atrial fibrillation: Secondary | ICD-10-CM | POA: Insufficient documentation

## 2022-09-13 NOTE — Progress Notes (Signed)
Patient returns for ECG after starting scheduled flecainide 100 mg BID. ECG shows  SR, 1st degree AV block Vent. rate 90 BPM PR interval 214 ms QRS duration 104 ms QT/QTcB 362/442 ms  Patient doing well with no afib episodes since his last visit. F/u in the AF clinic in 3 months.

## 2022-09-28 ENCOUNTER — Telehealth (HOSPITAL_COMMUNITY): Payer: Self-pay | Admitting: *Deleted

## 2022-09-28 NOTE — Telephone Encounter (Signed)
Patient called in stating he has been in AF since earlier this morning HRs in the 100-110s. Overall feels ok but feels palpitations. Discussed with Rudi Coco NP will give extra 50mg  of flecainide now with normal dosing this evening. Pt will call tomorrow if still in AF at that time for further instruction. Pt in agreement.

## 2022-12-15 ENCOUNTER — Ambulatory Visit (HOSPITAL_COMMUNITY): Payer: BC Managed Care – PPO | Admitting: Physician Assistant

## 2022-12-29 ENCOUNTER — Other Ambulatory Visit: Payer: Self-pay | Admitting: Cardiology

## 2022-12-29 NOTE — Telephone Encounter (Signed)
This is a A-Fib clinic pt 

## 2023-01-04 ENCOUNTER — Other Ambulatory Visit (HOSPITAL_COMMUNITY): Payer: Self-pay | Admitting: Physician Assistant

## 2023-01-05 ENCOUNTER — Encounter (HOSPITAL_COMMUNITY): Payer: Self-pay | Admitting: Physician Assistant

## 2023-01-05 ENCOUNTER — Ambulatory Visit (HOSPITAL_COMMUNITY)
Admission: RE | Admit: 2023-01-05 | Discharge: 2023-01-05 | Disposition: A | Discharge status: Home / Self Care | Payer: BC Managed Care – PPO | Source: Ambulatory Visit | Attending: Physician Assistant | Admitting: Physician Assistant

## 2023-01-05 VITALS — BP 136/80 | HR 95 | Ht 71.0 in | Wt >= 6400 oz

## 2023-01-05 DIAGNOSIS — G4733 Obstructive sleep apnea (adult) (pediatric): Secondary | ICD-10-CM | POA: Insufficient documentation

## 2023-01-05 DIAGNOSIS — I10 Essential (primary) hypertension: Secondary | ICD-10-CM | POA: Diagnosis not present

## 2023-01-05 DIAGNOSIS — Z6841 Body Mass Index (BMI) 40.0 and over, adult: Secondary | ICD-10-CM | POA: Insufficient documentation

## 2023-01-05 DIAGNOSIS — I480 Paroxysmal atrial fibrillation: Secondary | ICD-10-CM | POA: Diagnosis not present

## 2023-01-05 DIAGNOSIS — E669 Obesity, unspecified: Secondary | ICD-10-CM | POA: Diagnosis not present

## 2023-01-05 DIAGNOSIS — Z86718 Personal history of other venous thrombosis and embolism: Secondary | ICD-10-CM | POA: Diagnosis not present

## 2023-01-05 DIAGNOSIS — E785 Hyperlipidemia, unspecified: Secondary | ICD-10-CM | POA: Insufficient documentation

## 2023-01-05 DIAGNOSIS — Z5181 Encounter for therapeutic drug level monitoring: Secondary | ICD-10-CM

## 2023-01-05 DIAGNOSIS — Z7901 Long term (current) use of anticoagulants: Secondary | ICD-10-CM | POA: Insufficient documentation

## 2023-01-05 DIAGNOSIS — Z79899 Other long term (current) drug therapy: Secondary | ICD-10-CM | POA: Diagnosis not present

## 2023-01-05 NOTE — Progress Notes (Signed)
Primary Care Physician: Osie Cheeks, PA-C Primary Electrophysiologist: Dr Curt Bears Referring Physician: Dr Curt Bears   Joshua Mcgee is a 57 y.o. male with a history of HTN, OSA, DVT, atrial fibrillation who presents for follow up in the [facility].  He was seen in the emergency room 05/10/2018 with 3 unsuccessful attempts at cardioversion.  He was given 300 mg of flecainide which converted him to normal rhythm. Has been maintained on PIP flecainide. Patient is on warfarin for a CHADS2VASC score of 1. He had more frequent episodes of afib and was started on scheduled flecainide 09/2022.  On follow up today, patient reports that he has done well since his last visit. He called the clinic 09/28/22 with palpitations but this resolved quickly. In hindsight, patient thinks this was more GI related. He denies any bleeding issues on anticoagulation.   Today, he denies symptoms of palpitations, chest pain, shortness of breath, orthopnea, PND, lower extremity edema, dizziness, presyncope, syncope, snoring, daytime somnolence, bleeding, or neurologic sequela. The patient is tolerating medications without difficulties and is otherwise without complaint today.    Atrial Fibrillation Risk Factors:  he does have symptoms or diagnosis of sleep apnea. he is compliant with CPAP therapy. he does not have a history of rheumatic fever.   he has a BMI of Body mass index is 60.39 kg/m.Marland Kitchen Filed Weights   01/05/23 1520  Weight: (!) 196.4 kg    Family History  Problem Relation Age of Onset   Heart disease Mother    Diabetes Mother      Atrial Fibrillation Management history:  Previous antiarrhythmic drugs: flecainide  Previous cardioversions: 2019 Previous ablations: none CHADS2VASC score: 1 Anticoagulation history: warfarin    Past Medical History:  Diagnosis Date   Atrial fibrillation    on Coumadin   Chronic anticoagulation    Depression    Hypertension     OSA on CPAP    No past surgical history on file.  Current Outpatient Medications  Medication Sig Dispense Refill   albuterol (PROVENTIL HFA;VENTOLIN HFA) 108 (90 Base) MCG/ACT inhaler Inhale 2 puffs into the lungs every 4 (four) hours as needed.     Calcium Carb-Cholecalciferol (CALCIUM 1000 + D PO) Take by mouth.     carvedilol (COREG) 25 MG tablet TAKE 1 TABLET BY MOUTH 2 TIMES A DAY 180 tablet 2   flecainide (TAMBOCOR) 100 MG tablet TAKE 1 TABLET BY MOUTH TWICE DAILY 60 tablet 3   Fluticasone-Umeclidin-Vilant 100-62.5-25 MCG/ACT AEPB Inhale 1 puff into the lungs daily.     glipiZIDE (GLUCOTROL XL) 5 MG 24 hr tablet Take 5 mg by mouth daily.     glucose blood test strip TEST BLOOD SUGAR 2 TO 3 TIMES DAILY AS DIRECTED     montelukast (SINGULAIR) 10 MG tablet Take 10 mg by mouth daily.     omeprazole (PRILOSEC) 20 MG capsule Take 20 mg by mouth daily.     PARoxetine (PAXIL) 40 MG tablet Take 60 mg by mouth daily.     predniSONE (DELTASONE) 5 MG tablet Take 5 mg by mouth daily.     TRULICITY 3 0000000 SOPN Inject into the skin.     valsartan (DIOVAN) 320 MG tablet Take 320 mg by mouth daily.     warfarin (COUMADIN) 10 MG tablet Take 10 mg by mouth See admin instructions. Take 5 mg Mon. Tues. Thurs and, Sat   Take 10 mg  Wed. Sun. Fri  Take in the morning  No current facility-administered medications for this encounter.    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  Vaping Use   Vaping Use: Never used  Substance and Sexual Activity   Alcohol use: Yes    Alcohol/week: 12.0 standard drinks of alcohol    Types: 12 Cans of beer per week    Comment: 12 pack a year 09/06/22   Drug use: Never   Sexual activity: Not on file  Other Topics Concern   Not on file  Social History Narrative   Not on file   Social Determinants of Health   Financial Resource Strain: Not on file  Food Insecurity: Not on file  Transportation Needs: Not on file  Physical Activity: Not on file  Stress: Not on file  Social Connections: Not on file  Intimate Partner Violence: Not on file     ROS- All systems are reviewed and negative except as per the HPI above.  Physical Exam: Vitals:   01/05/23 1520  BP: 136/80  Pulse: 95  Weight: (!) 196.4 kg  Height: 5\' 11"  (1.803 m)    GEN- The patient is a well appearing male, alert and oriented x 3 today.   HEENT-head normocephalic, atraumatic, sclera clear, conjunctiva pink, hearing intact, trachea midline. Lungs- Clear to ausculation bilaterally, normal work of breathing Heart- Regular rate and rhythm, no murmurs, rubs or gallops  GI- soft, NT, ND, + BS Extremities- no clubbing, cyanosis, or edema MS- no significant deformity or atrophy Skin- no rash or lesion Psych- euthymic mood, full affect Neuro- strength and sensation are intact   Wt Readings from Last 3 Encounters:  01/05/23 (!) 196.4 kg  09/06/22 (!) 196.4 kg  07/22/22 (!) 200.4 kg    EKG today demonstrates  SR Vent. rate 95 BPM PR interval 112 ms QRS duration 106 ms QT/QTcB 372/467 ms  Echo 05/09/18 demonstrated  Left ventricle: The cavity size was normal. Wall thickness was    increased in a pattern of mild LVH. Systolic function was normal.    The estimated ejection fraction was in the range of 55% to 60%.    Although no diagnostic regional wall motion abnormality was    identified,  this possibility cannot be completely excluded on the    basis of this study. The study was not technically sufficient to    allow evaluation of LV diastolic dysfunction due to atrial    fibrillation.  - Aortic valve: There was no stenosis.  - Mitral valve: Mildly calcified annulus. There was no significant    regurgitation.  - Right ventricle: Poorly visualized. The cavity size was normal.    Systolic function was normal.  - Pulmonary arteries: No complete TR doppler jet so unable to    estimate PA systolic pressure.  - Systemic veins: IVC poorly visualized.  Impressions:   - Technically difficult study with poor acoustic windows. Normal LV    size with mild LV hypertrophy. EF 55-60%. Normal RV size and    systolic function. No significant valvular abnormalities.  Epic records are reviewed at length today  CHA2DS2-VASc Score = 1  The patient's score is based upon: CHF History: 0 HTN History: 1 Diabetes History: 0 Stroke History: 0 Vascular Disease History: 0 Age Score: 0 Gender Score: 0       ASSESSMENT AND PLAN: 1. Paroxysmal Atrial Fibrillation (ICD10:  I48.0) The patient's CHA2DS2-VASc score is 1, indicating a 0.6% annual risk of stroke.   Patient appears to be maintaining SR.  Continue flecainide 100 mg BID Continue carvedilol 25 mg BID Continue warfarin   2. Obesity Body mass index is 60.39 kg/m. Lifestyle modification was discussed and encouraged including regular physical activity and weight reduction.  3. Obstructive sleep apnea Encouraged compliance with CPAP therapy.   4. HTN Stable, no changes today.   Follow up in the AF clinic in 6 months.    [facility] [address] [place], [place] [address] [phone] 01/05/2023 3:32 PM

## 2023-04-10 ENCOUNTER — Other Ambulatory Visit (HOSPITAL_COMMUNITY): Payer: Self-pay | Admitting: Physician Assistant

## 2023-07-25 ENCOUNTER — Encounter (HOSPITAL_COMMUNITY): Payer: Self-pay | Admitting: Physician Assistant

## 2023-07-25 ENCOUNTER — Ambulatory Visit (HOSPITAL_COMMUNITY)
Admission: RE | Admit: 2023-07-25 | Discharge: 2023-07-25 | Disposition: A | Discharge status: Home / Self Care | Payer: BC Managed Care – PPO | Source: Ambulatory Visit | Attending: Physician Assistant | Admitting: Physician Assistant

## 2023-07-25 VITALS — BP 116/74 | HR 76 | Ht 71.0 in | Wt 367.6 lb

## 2023-07-25 DIAGNOSIS — I509 Heart failure, unspecified: Secondary | ICD-10-CM | POA: Diagnosis not present

## 2023-07-25 DIAGNOSIS — E669 Obesity, unspecified: Secondary | ICD-10-CM | POA: Diagnosis not present

## 2023-07-25 DIAGNOSIS — I480 Paroxysmal atrial fibrillation: Secondary | ICD-10-CM | POA: Diagnosis not present

## 2023-07-25 DIAGNOSIS — Z7901 Long term (current) use of anticoagulants: Secondary | ICD-10-CM | POA: Diagnosis not present

## 2023-07-25 DIAGNOSIS — G4733 Obstructive sleep apnea (adult) (pediatric): Secondary | ICD-10-CM | POA: Insufficient documentation

## 2023-07-25 DIAGNOSIS — Z6841 Body Mass Index (BMI) 40.0 and over, adult: Secondary | ICD-10-CM | POA: Insufficient documentation

## 2023-07-25 DIAGNOSIS — I110 Hypertensive heart disease with heart failure: Secondary | ICD-10-CM | POA: Diagnosis not present

## 2023-07-25 DIAGNOSIS — Z79899 Other long term (current) drug therapy: Secondary | ICD-10-CM | POA: Diagnosis not present

## 2023-07-25 DIAGNOSIS — Z5181 Encounter for therapeutic drug level monitoring: Secondary | ICD-10-CM | POA: Diagnosis not present

## 2023-07-25 NOTE — Progress Notes (Signed)
Primary Care Physician: Isabella Bowens, PA-C Primary Electrophysiologist: Dr Elberta Fortis Referring Physician: Dr Elberta Fortis   Joshua Mcgee is a 57 y.o. male with a history of HTN, OSA, DVT, atrial fibrillation who presents for follow up in the [facility].  He was seen in the emergency room 05/10/2018 with 3 unsuccessful attempts at cardioversion.  He was given 300 mg of flecainide which converted him to normal rhythm. Has been maintained on PIP flecainide. Patient is on warfarin for a CHADS2VASC score of 1. He had more frequent episodes of afib and was started on scheduled flecainide 09/2022.  On follow up today, patient reports that he has done well since his last visit. He has not had any interim symptoms of afib. He has lost about 65 lbs doing a high protein diet. No bleeding issues on anticoagulation.   Today, he denies symptoms of palpitations, chest pain, shortness of breath, orthopnea, PND, lower extremity edema, dizziness, presyncope, syncope, snoring, daytime somnolence, bleeding, or neurologic sequela. The patient is tolerating medications without difficulties and is otherwise without complaint today.    Atrial Fibrillation Risk Factors:  he does have symptoms or diagnosis of sleep apnea. he is compliant with CPAP therapy. he does not have a history of rheumatic fever.   Atrial Fibrillation Management history:  Previous antiarrhythmic drugs: flecainide  Previous cardioversions: 2019 Previous ablations: none Anticoagulation history: warfarin    Past Medical History:  Diagnosis Date   Atrial fibrillation (HCC)    on Coumadin   Chronic anticoagulation    Depression    Hypertension    OSA on CPAP     Current Outpatient Medications  Medication Sig Dispense Refill   albuterol (PROVENTIL HFA;VENTOLIN HFA) 108 (90 Base) MCG/ACT inhaler Inhale 2 puffs into the lungs every 4 (four) hours as needed.     Calcium Carb-Cholecalciferol (CALCIUM 1000 + D  PO) Take by mouth.     carvedilol (COREG) 25 MG tablet TAKE 1 TABLET BY MOUTH 2 TIMES A DAY 180 tablet 2   flecainide (TAMBOCOR) 100 MG tablet TAKE 1 TABLET BY MOUTH 2 TIMES A DAY 60 tablet 3   Fluticasone-Umeclidin-Vilant 100-62.5-25 MCG/ACT AEPB Inhale 1 puff into the lungs daily.     glipiZIDE (GLUCOTROL XL) 5 MG 24 hr tablet Take 5 mg by mouth daily.     glucose blood test strip TEST BLOOD SUGAR 2 TO 3 TIMES DAILY AS DIRECTED     montelukast (SINGULAIR) 10 MG tablet Take 10 mg by mouth daily.     MOUNJARO 7.5 MG/0.5ML Pen Inject 7.5 mg into the skin once a week.     omeprazole (PRILOSEC) 20 MG capsule Take 20 mg by mouth daily.     PARoxetine (PAXIL) 40 MG tablet Take 60 mg by mouth daily.     predniSONE (DELTASONE) 5 MG tablet Take 5 mg by mouth daily.     valsartan (DIOVAN) 320 MG tablet Take 320 mg by mouth daily.     warfarin (COUMADIN) 10 MG tablet Take 10 mg by mouth See admin instructions. Take 5 mg Mon. Tues. Thurs and, Sat   Take 10 mg  Wed. Sun. Fri  Take in the morning     No current facility-administered medications for this encounter.    ROS- All systems are reviewed and negative except as per the HPI above.  Physical Exam: Vitals:   07/25/23 1517  BP: 116/74  Pulse: 76  Weight: (!) 166.7 kg  Height: 5\' 11"  (1.803 m)  GEN: Well nourished, well developed in no acute distress NECK: No JVD; No carotid bruits CARDIAC: Regular rate and rhythm, no murmurs, rubs, gallops RESPIRATORY:  Clear to auscultation without rales, wheezing or rhonchi  ABDOMEN: Soft, non-tender, non-distended EXTREMITIES:  No edema; No deformity    Wt Readings from Last 3 Encounters:  07/25/23 (!) 166.7 kg  01/05/23 (!) 196.4 kg  09/06/22 (!) 196.4 kg    EKG today demonstrates  SR, 1st degree AV block Vent. rate 76 BPM PR interval 212 ms QRS duration 106 ms QT/QTcB 408/459 ms  Echo 05/09/18 demonstrated  Left ventricle: The cavity size was normal. Wall thickness was     increased in a pattern of mild LVH. Systolic function was normal.    The estimated ejection fraction was in the range of 55% to 60%.    Although no diagnostic regional wall motion abnormality was    identified, this possibility cannot be completely excluded on the    basis of this study. The study was not technically sufficient to    allow evaluation of LV diastolic dysfunction due to atrial    fibrillation.  - Aortic valve: There was no stenosis.  - Mitral valve: Mildly calcified annulus. There was no significant    regurgitation.  - Right ventricle: Poorly visualized. The cavity size was normal.    Systolic function was normal.  - Pulmonary arteries: No complete TR doppler jet so unable to    estimate PA systolic pressure.  - Systemic veins: IVC poorly visualized.   Impressions:   - Technically difficult study with poor acoustic windows. Normal LV    size with mild LV hypertrophy. EF 55-60%. Normal RV size and    systolic function. No significant valvular abnormalities.  Epic records are reviewed at length today  CHA2DS2-VASc Score = 1  The patient's score is based upon: CHF History: 0 HTN History: 1 Diabetes History: 0 Stroke History: 0 Vascular Disease History: 0 Age Score: 0 Gender Score: 0        ASSESSMENT AND PLAN: Paroxysmal Atrial Fibrillation (ICD10:  I48.0) The patient's CHA2DS2-VASc score is 1, indicating a 0.6% annual risk of stroke.   Patient appears to be maintaining SR Continue flecainide 100 mg BID Continue carvedilol 25 mg BID Continue warfarin   Obesity Body mass index is 51.27 kg/m.  Encouraged lifestyle modification. Patient has done well and lost ~65 lbs.   OSA  Encouraged nightly CPAP  HTN Stable on current regimen   Follow up in the AF clinic in 6 months.    Jorja Loa PA-C [facility] [address] [place], [place] [address] [phone] 07/25/2023 3:47 PM

## 2023-08-09 ENCOUNTER — Other Ambulatory Visit (HOSPITAL_COMMUNITY): Payer: Self-pay | Admitting: Physician Assistant

## 2023-09-26 ENCOUNTER — Other Ambulatory Visit (HOSPITAL_COMMUNITY): Payer: Self-pay | Admitting: Physician Assistant

## 2023-11-27 ENCOUNTER — Encounter: Payer: Self-pay | Admitting: Cardiology

## 2023-11-27 ENCOUNTER — Ambulatory Visit: Payer: 59 | Attending: Cardiology | Admitting: Cardiology

## 2023-11-27 VITALS — BP 102/60 | HR 64 | Ht 70.5 in | Wt 367.2 lb

## 2023-11-27 DIAGNOSIS — I10 Essential (primary) hypertension: Secondary | ICD-10-CM

## 2023-11-27 DIAGNOSIS — G4733 Obstructive sleep apnea (adult) (pediatric): Secondary | ICD-10-CM | POA: Diagnosis not present

## 2023-11-27 DIAGNOSIS — I480 Paroxysmal atrial fibrillation: Secondary | ICD-10-CM

## 2023-11-27 DIAGNOSIS — Z79899 Other long term (current) drug therapy: Secondary | ICD-10-CM | POA: Diagnosis not present

## 2023-11-27 NOTE — Progress Notes (Signed)
  Electrophysiology Office Note:   Date:  11/27/2023  ID:  Joshua Mcgee, DOB December 23, 1965, MRN [id number]  Primary Cardiologist: None Primary Heart Failure: None Electrophysiologist: Fernado Brigante Jorja Loa, MD      History of Present Illness:   Joshua Mcgee is a 58 y.o. male with h/o atrial fibrillation, hypertension, sleep apnea, obesity seen today for routine electrophysiology followup.   Since last being seen in our clinic the patient reports doing well.  He has noted no further episodes of atrial fibrillation.  He was started on flecainide twice daily by the atrial fibrillation clinic.  Since then he has done well and is without complaint.  He recently had a partial nephrectomy for what sounds like renal cell carcinoma.  He is doing well from a surgical perspective.  He is losing weight.  He has been working on a high-protein diet.  he denies chest pain, palpitations, dyspnea, PND, orthopnea, nausea, vomiting, dizziness, syncope, edema, weight gain, or early satiety.   Review of systems complete and found to be negative unless listed in HPI.   EP Information / Studies Reviewed:    EKG is ordered today. Personal review as below.  EKG Interpretation Date/Time:  Monday November 27 2023 08:21:06 EST Ventricular Rate:  64 PR Interval:  208 QRS Duration:  104 QT Interval:  440 QTC Calculation: 453 R Axis:   -31  Text Interpretation: Normal sinus rhythm Left axis deviation Abnormal ECG When compared with ECG of 25-Jul-2023 15:21, No significant change was found Confirmed by Mohamed Portlock ([id number]) on 11/27/2023 8:45:15 AM     Risk Assessment/Calculations:    CHA2DS2-VASc Score = 1   This indicates a 0.6% annual risk of stroke. The patient's score is based upon: CHF History: 0 HTN History: 1 Diabetes History: 0 Stroke History: 0 Vascular Disease History: 0 Age Score: 0 Gender Score: 0              Physical Exam:   VS:  BP 102/60   Pulse 64   Ht 5' 10.5" (1.791 m)   Wt (!) 367 lb  3.2 oz (166.6 kg)   SpO2 97%   BMI 51.94 kg/m    Wt Readings from Last 3 Encounters:  11/27/23 (!) 367 lb 3.2 oz (166.6 kg)  07/25/23 (!) 367 lb 9.6 oz (166.7 kg)  01/05/23 (!) 433 lb (196.4 kg)     GEN: Well nourished, well developed in no acute distress NECK: No JVD; No carotid bruits CARDIAC: Regular rate and rhythm, no murmurs, rubs, gallops RESPIRATORY:  Clear to auscultation without rales, wheezing or rhonchi  ABDOMEN: Soft, non-tender, non-distended EXTREMITIES:  No edema; No deformity   ASSESSMENT AND PLAN:    1.  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  On flecainide 100 mg twice daily.  QRS remains narrow.  He has not had any further episodes of atrial fibrillation.  Madellyn Denio continue with current management.  2.  Obstructive sleep apnea: CPAP compliance encouraged  3.  Morbid obesity: Has been losing weight with high-protein diet.  4.  High risk medication monitoring: Currently on flecainide.  QRS remains narrow  5.  Hypertension: Currently well-controlled  Follow up with Afib Clinic in 6 months  Signed, Anjolaoluwa Siguenza Jorja Loa, MD

## 2023-11-27 NOTE — Patient Instructions (Signed)
 Medication Instructions:  Your physician recommends that you continue on your current medications as directed. Please refer to the Current Medication list given to you today.  *If you need a refill on your cardiac medications before your next appointment, please call your pharmacy*   Lab Work: None ordered   Testing/Procedures: None ordered   Follow-Up: At [facility], you and your health needs are our priority.  As part of our continuing mission to provide you with exceptional heart care, we have created designated Provider Care Teams.  These Care Teams include your primary Cardiologist (physician) and Advanced Practice Providers (APPs -  Physician Assistants and Nurse Practitioners) who all work together to provide you with the care you need, when you need it.  Your next appointment:   6 month(s)  The format for your next appointment:   In Person  Provider:   You will follow up in the Atrial Fibrillation Clinic located at [facility]. Your provider will be: Clint R. Fenton, PA-C or Lake Bells, PA-C    Thank you for choosing [facility]!!   Dory Horn, RN [phone]

## 2024-03-27 ENCOUNTER — Other Ambulatory Visit (HOSPITAL_COMMUNITY): Payer: Self-pay | Admitting: Physician Assistant

## 2024-04-12 ENCOUNTER — Other Ambulatory Visit (HOSPITAL_COMMUNITY): Payer: Self-pay | Admitting: Physician Assistant

## 2024-05-27 ENCOUNTER — Ambulatory Visit (HOSPITAL_COMMUNITY)
Admission: RE | Admit: 2024-05-27 | Discharge: 2024-05-27 | Disposition: A | Discharge status: Home / Self Care | Payer: 59 | Source: Ambulatory Visit | Attending: Physician Assistant | Admitting: Physician Assistant

## 2024-05-27 VITALS — BP 112/62 | HR 70 | Ht 70.5 in | Wt 367.2 lb

## 2024-05-27 DIAGNOSIS — Z79899 Other long term (current) drug therapy: Secondary | ICD-10-CM | POA: Diagnosis not present

## 2024-05-27 DIAGNOSIS — I4891 Unspecified atrial fibrillation: Secondary | ICD-10-CM

## 2024-05-27 DIAGNOSIS — Z5181 Encounter for therapeutic drug level monitoring: Secondary | ICD-10-CM

## 2024-05-27 DIAGNOSIS — I480 Paroxysmal atrial fibrillation: Secondary | ICD-10-CM

## 2024-05-27 NOTE — Progress Notes (Signed)
 Primary Care Physician: Walke, Devyn Leeann, PA-C Primary Electrophysiologist: Dr Inocencio Referring Physician: Dr Inocencio   Joshua Mcgee is a 58 y.o. male with a history of HTN, OSA, DVT, atrial fibrillation who presents for follow up in the [facility].  He was seen in the emergency room 05/10/2018 with 3 unsuccessful attempts at cardioversion.  He was given 300 mg of flecainide  which converted him to normal rhythm. Has been maintained on PIP flecainide . Patient is on warfarin. He had more frequent episodes of afib and was started on scheduled flecainide  09/2022.  Patient returns for follow up for atrial fibrillation and flecainide  monitoring. He reports that he has done well since his last visit. He denies any interim symptoms of afib. No bleeding issues on anticoagulation.   Today, he  denies symptoms of palpitations, chest pain, shortness of breath, orthopnea, PND, lower extremity edema, dizziness, presyncope, syncope, bleeding, or neurologic sequela. The patient is tolerating medications without difficulties and is otherwise without complaint today.    Atrial Fibrillation Risk Factors:  he does have symptoms or diagnosis of sleep apnea. he does not have a history of rheumatic fever.   Atrial Fibrillation Management history:  Previous antiarrhythmic drugs: flecainide   Previous cardioversions: 2019 Previous ablations: none Anticoagulation history: warfarin    Past Medical History:  Diagnosis Date   Atrial fibrillation (HCC)    on Coumadin   Chronic anticoagulation    Depression    Hypertension    OSA on CPAP     Current Outpatient Medications  Medication Sig Dispense Refill   acetaminophen  (TYLENOL ) 500 MG tablet Take 500 mg by mouth as needed.     albuterol (PROVENTIL HFA;VENTOLIN HFA) 108 (90 Base) MCG/ACT inhaler Inhale 2 puffs into the lungs every 4 (four) hours as needed.     Calcium Carb-Cholecalciferol (CALCIUM 1000 + D PO) Take 1  capsule by mouth daily.     carvedilol  (COREG ) 25 MG tablet TAKE 1 TABLET BY MOUTH 2 TIMES A DAY 180 tablet 1   cholecalciferol (VITAMIN D3) 25 MCG (1000 UNIT) tablet Take 1,000 Units by mouth daily.     flecainide  (TAMBOCOR ) 100 MG tablet TAKE 1 TABLET BY MOUTH 2 TIMES A DAY 60 tablet 3   Fluticasone-Umeclidin-Vilant 100-62.5-25 MCG/ACT AEPB Inhale 1 puff into the lungs daily.     glipiZIDE (GLUCOTROL XL) 5 MG 24 hr tablet Take 5 mg by mouth daily.     glucose blood test strip TEST BLOOD SUGAR 2 TO 3 TIMES DAILY AS DIRECTED     MOUNJARO 7.5 MG/0.5ML Pen Inject 7.5 mg into the skin once a week.     omeprazole (PRILOSEC) 20 MG capsule Take 20 mg by mouth daily.     PARoxetine (PAXIL) 40 MG tablet Take 60 mg by mouth daily.     predniSONE (DELTASONE) 5 MG tablet Take 5 mg by mouth daily.     valsartan (DIOVAN) 320 MG tablet Take 320 mg by mouth daily.     warfarin (COUMADIN) 10 MG tablet Take 10 mg by mouth See admin instructions. Take 5 mg Mon. Tues. Thurs and, Sat   Take 10 mg  Wed. Sun. Fri  Take in the morning     No current facility-administered medications for this encounter.    ROS- All systems are reviewed and negative except as per the HPI above.  Physical Exam: Vitals:   05/27/24 1512  BP: 112/62  Pulse: 70  Weight: (!) 166.6 kg  Height: 5' 10.5 (  1.791 m)    GEN: Well nourished, well developed in no acute distress CARDIAC: Regular rate and rhythm, no murmurs, rubs, gallops RESPIRATORY:  Clear to auscultation without rales, wheezing or rhonchi  ABDOMEN: Soft, non-tender, non-distended EXTREMITIES:  No edema; No deformity    Wt Readings from Last 3 Encounters:  05/27/24 (!) 166.6 kg  11/27/23 (!) 166.6 kg  07/25/23 (!) 166.7 kg    EKG today demonstrates  SR, 1st degree AV block Vent. rate 70 BPM PR interval 214 ms QRS duration 98 ms QT/QTcB 424/457 ms   Echo 05/09/18 demonstrated  Left ventricle: The cavity size was normal. Wall thickness was    increased  in a pattern of mild LVH. Systolic function was normal.    The estimated ejection fraction was in the range of 55% to 60%.    Although no diagnostic regional wall motion abnormality was    identified, this possibility cannot be completely excluded on the    basis of this study. The study was not technically sufficient to    allow evaluation of LV diastolic dysfunction due to atrial    fibrillation.  - Aortic valve: There was no stenosis.  - Mitral valve: Mildly calcified annulus. There was no significant    regurgitation.  - Right ventricle: Poorly visualized. The cavity size was normal.    Systolic function was normal.  - Pulmonary arteries: No complete TR doppler jet so unable to    estimate PA systolic pressure.  - Systemic veins: IVC poorly visualized.   Impressions:   - Technically difficult study with poor acoustic windows. Normal LV    size with mild LV hypertrophy. EF 55-60%. Normal RV size and    systolic function. No significant valvular abnormalities.  Epic records are reviewed at length today   CHA2DS2-VASc Score = 1  The patient's score is based upon: CHF History: 0 HTN History: 1 Diabetes History: 0 Stroke History: 0 Vascular Disease History: 0 Age Score: 0 Gender Score: 0       ASSESSMENT AND PLAN: Paroxysmal Atrial Fibrillation (ICD10:  I48.0) The patient's CHA2DS2-VASc score is 1, indicating a 0.6% annual risk of stroke.   Patient appears to be maintaining SR Continue flecainide  100 mg BID Continue carvedilol  25 mg BID Continue warfarin  High Risk Medication Monitoring (ICD 10: Z79.899) Patient requires ongoing monitoring for anti-arrhythmic medication which has the potential to cause life threatening arrhythmias. Intervals on ECG acceptable for flecainide  monitoring.     Obesity Body mass index is 51.94 kg/m.  Encouraged lifestyle modification He is on Mounjaro  OSA  Encouraged nightly CPAP  HTN Stable on current regimen   Follow up in  the AF clinic in 6 months.    Daril Kicks PA-C [facility] [address] [place], [place] [address] [phone] 05/27/2024 3:35 PM

## 2024-07-12 ENCOUNTER — Other Ambulatory Visit (HOSPITAL_COMMUNITY): Payer: Self-pay | Admitting: Physician Assistant

## 2024-09-25 ENCOUNTER — Other Ambulatory Visit (HOSPITAL_COMMUNITY): Payer: Self-pay | Admitting: Physician Assistant

## 2024-11-27 ENCOUNTER — Ambulatory Visit (HOSPITAL_COMMUNITY): Admitting: Physician Assistant
